# Patient Record
Sex: Male | Born: 1980 | Race: White | Hispanic: No | Marital: Married | Smoking: Former smoker
Health system: Southern US, Community
[De-identification: ages and names within clinical notes are randomized; demographics above are authoritative.]

## PROBLEM LIST (undated history)

## (undated) DIAGNOSIS — F32A Depression, unspecified: Secondary | ICD-10-CM

## (undated) DIAGNOSIS — F4310 Post-traumatic stress disorder, unspecified: Secondary | ICD-10-CM

## (undated) DIAGNOSIS — F329 Major depressive disorder, single episode, unspecified: Secondary | ICD-10-CM

## (undated) DIAGNOSIS — I10 Essential (primary) hypertension: Secondary | ICD-10-CM

---

## 2014-10-17 DIAGNOSIS — G4700 Insomnia, unspecified: Secondary | ICD-10-CM | POA: Insufficient documentation

## 2014-10-17 DIAGNOSIS — G43909 Migraine, unspecified, not intractable, without status migrainosus: Secondary | ICD-10-CM | POA: Insufficient documentation

## 2014-10-31 LAB — LIPID PANEL
CHOLESTEROL: 161 mg/dL (ref 0–200)
HDL: 26 mg/dL — AB (ref 35–70)
LDL Cholesterol: 110 mg/dL
TRIGLYCERIDES: 127 mg/dL (ref 40–160)

## 2014-10-31 LAB — TSH: TSH: 0.53 u[IU]/mL (ref 0.41–5.90)

## 2014-10-31 LAB — HEPATIC FUNCTION PANEL
ALK PHOS: 118 U/L (ref 25–125)
ALT: 25 U/L (ref 10–40)
AST: 17 U/L (ref 14–40)
BILIRUBIN, TOTAL: 0.3 mg/dL
Bilirubin, Direct: 0.1 mg/dL (ref 0.01–0.4)

## 2014-10-31 LAB — BASIC METABOLIC PANEL
BUN: 15 mg/dL (ref 4–21)
CREATININE: 1 mg/dL (ref 0.6–1.3)
Glucose: 103 mg/dL
Potassium: 4.2 mmol/L (ref 3.4–5.3)
Sodium: 141 mmol/L (ref 137–147)

## 2014-10-31 LAB — HEMOGLOBIN A1C: HEMOGLOBIN A1C: 5.5

## 2014-11-07 ENCOUNTER — Ambulatory Visit
Admit: 2014-11-07 | Disposition: A | Discharge status: Home / Self Care | Payer: Self-pay | Attending: Urology | Admitting: Urology

## 2014-12-14 ENCOUNTER — Ambulatory Visit: Payer: Self-pay | Admitting: Licensed Clinical Social Worker

## 2015-01-11 ENCOUNTER — Ambulatory Visit: Payer: Self-pay

## 2015-01-25 ENCOUNTER — Ambulatory Visit: Payer: Self-pay

## 2015-01-25 DIAGNOSIS — I10 Essential (primary) hypertension: Secondary | ICD-10-CM | POA: Insufficient documentation

## 2015-01-25 DIAGNOSIS — D72829 Elevated white blood cell count, unspecified: Secondary | ICD-10-CM | POA: Insufficient documentation

## 2015-01-25 DIAGNOSIS — I8390 Asymptomatic varicose veins of unspecified lower extremity: Secondary | ICD-10-CM | POA: Insufficient documentation

## 2015-01-25 DIAGNOSIS — R635 Abnormal weight gain: Secondary | ICD-10-CM | POA: Insufficient documentation

## 2015-01-25 LAB — CBC AND DIFFERENTIAL
HEMATOCRIT: 45 % (ref 41–53)
HEMOGLOBIN: 15.4 g/dL (ref 13.5–17.5)
Neutrophils Absolute: 7 /uL
PLATELETS: 253 10*3/uL (ref 150–399)
WBC: 10.8 10*3/mL

## 2015-03-20 ENCOUNTER — Other Ambulatory Visit: Payer: Self-pay

## 2015-04-03 ENCOUNTER — Ambulatory Visit: Payer: Self-pay

## 2015-11-29 DIAGNOSIS — D72829 Elevated white blood cell count, unspecified: Secondary | ICD-10-CM

## 2015-11-29 DIAGNOSIS — I10 Essential (primary) hypertension: Secondary | ICD-10-CM

## 2015-11-29 DIAGNOSIS — G4700 Insomnia, unspecified: Secondary | ICD-10-CM

## 2015-11-29 DIAGNOSIS — R635 Abnormal weight gain: Secondary | ICD-10-CM

## 2015-11-29 DIAGNOSIS — G43919 Migraine, unspecified, intractable, without status migrainosus: Secondary | ICD-10-CM

## 2015-11-29 DIAGNOSIS — I8390 Asymptomatic varicose veins of unspecified lower extremity: Secondary | ICD-10-CM

## 2016-01-28 ENCOUNTER — Emergency Department
Admission: EM | Admit: 2016-01-28 | Discharge: 2016-01-28 | Disposition: A | Discharge status: Home / Self Care | Payer: Self-pay | Attending: Emergency Medicine | Admitting: Emergency Medicine

## 2016-01-28 DIAGNOSIS — F419 Anxiety disorder, unspecified: Secondary | ICD-10-CM

## 2016-01-28 DIAGNOSIS — F32A Depression, unspecified: Secondary | ICD-10-CM

## 2016-01-28 DIAGNOSIS — Z87891 Personal history of nicotine dependence: Secondary | ICD-10-CM | POA: Insufficient documentation

## 2016-01-28 DIAGNOSIS — I10 Essential (primary) hypertension: Secondary | ICD-10-CM | POA: Insufficient documentation

## 2016-01-28 DIAGNOSIS — Z79899 Other long term (current) drug therapy: Secondary | ICD-10-CM | POA: Insufficient documentation

## 2016-01-28 DIAGNOSIS — F329 Major depressive disorder, single episode, unspecified: Secondary | ICD-10-CM | POA: Insufficient documentation

## 2016-01-28 LAB — CBC WITH DIFFERENTIAL/PLATELET
BASOS ABS: 0 10*3/uL (ref 0–0.1)
Basophils Relative: 0 %
EOS PCT: 0 %
Eosinophils Absolute: 0 10*3/uL (ref 0–0.7)
HCT: 47.1 % (ref 40.0–52.0)
HEMOGLOBIN: 16.1 g/dL (ref 13.0–18.0)
LYMPHS PCT: 18 %
Lymphs Abs: 2.2 10*3/uL (ref 1.0–3.6)
MCH: 31.8 pg (ref 26.0–34.0)
MCHC: 34.1 g/dL (ref 32.0–36.0)
MCV: 93.3 fL (ref 80.0–100.0)
Monocytes Absolute: 1 10*3/uL (ref 0.2–1.0)
Monocytes Relative: 8 %
NEUTROS ABS: 9.4 10*3/uL — AB (ref 1.4–6.5)
NEUTROS PCT: 74 %
PLATELETS: 202 10*3/uL (ref 150–440)
RBC: 5.05 MIL/uL (ref 4.40–5.90)
RDW: 13.1 % (ref 11.5–14.5)
WBC: 12.7 10*3/uL — AB (ref 3.8–10.6)

## 2016-01-28 LAB — COMPREHENSIVE METABOLIC PANEL
ALT: 28 U/L (ref 17–63)
ANION GAP: 8 (ref 5–15)
AST: 20 U/L (ref 15–41)
Albumin: 4.5 g/dL (ref 3.5–5.0)
Alkaline Phosphatase: 98 U/L (ref 38–126)
BUN: 14 mg/dL (ref 6–20)
CHLORIDE: 107 mmol/L (ref 101–111)
CO2: 24 mmol/L (ref 22–32)
Calcium: 9.1 mg/dL (ref 8.9–10.3)
Creatinine, Ser: 0.8 mg/dL (ref 0.61–1.24)
Glucose, Bld: 97 mg/dL (ref 65–99)
POTASSIUM: 3.7 mmol/L (ref 3.5–5.1)
SODIUM: 139 mmol/L (ref 135–145)
Total Bilirubin: 1.3 mg/dL — ABNORMAL HIGH (ref 0.3–1.2)
Total Protein: 7.6 g/dL (ref 6.5–8.1)

## 2016-01-28 LAB — LIPASE, BLOOD: LIPASE: 17 U/L (ref 11–51)

## 2016-01-28 NOTE — ED Provider Notes (Signed)
[facility] Emergency Department Provider Note   ____________________________________________  Time seen:  I have reviewed the triage vital signs and the triage nursing note.  HISTORY  Chief Complaint Abdominal Pain   Historian Patient  HPI Malik Flores is a 35 y.o. male who is here for evaluation of anxiety and depression. Patient states that he has had no appetite and has recurrent thoughts of anxiety and fear and depression. 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 He takes care of 2 young children at home and handles most of the housework as his wife is currently a Consulting civil engineerstudent. He suffered the loss of his child at 35 years old about 2 years ago and still has a lot of grief and anxiety and depression related to that. He's not been previously diagnosed or treated for depression or anxiety, but states that several years ago even before he lost his child he had some symptoms similar to now in terms of decreased by mouth appetite and had an EGD and was told that his symptoms were probably due to stress.  He is having no thoughts of suicide. He is interested and potentially getting a referral to see a psychiatrist.  He states he had previously been on blood pressure medication, but then his blood pressure is under control until he had been off of it.    History reviewed. No pertinent past medical history.  Patient Active Problem List   Diagnosis Date Noted  . Hypertension 01/25/2015  . Weight increase 01/25/2015  . Leucocytosis 01/25/2015  . Varicosities of leg 01/25/2015  . Insomnia 10/17/2014  . Migraines 10/17/2014    History reviewed. No pertinent past surgical history.  Current Outpatient Rx  Name  Route  Sig  Dispense  Refill  . amLODipine (NORVASC) 5 MG tablet   Oral   Take 5 mg by mouth daily.         . nortriptyline (PAMELOR)  25 MG capsule   Oral   Take 25 mg by mouth at bedtime.           Allergies Review of patient's allergies indicates no known allergies.  Family History  Problem Relation Age of Onset  . Diabetes Mother   . Stroke Mother   . Hypertension Mother   . Diabetes Father   . Heart disease Father   . Lung disease Daughter     chronic  . Cancer Maternal Grandmother     Pancreatic  . Cancer Paternal Grandfather     Social History Social History  Substance Use Topics  . Smoking status: Former Smoker    Quit date: 07/28/2006  . Smokeless tobacco: None  . Alcohol Use: No    Review of Systems  Constitutional: Negative for fever. Eyes: Negative for visual changes. ENT: Negative for sore throat. Cardiovascular: Negative for chest pain. Respiratory: Negative for shortness of breath. Gastrointestinal: Negative for abdominal pain, vomiting and diarrhea.Decreased appetite Genitourinary: Negative for dysuria. Musculoskeletal: Negative for back pain. Skin: Negative for rash. Neurological: Negative for headache. 10 point Review of Systems otherwise negative ____________________________________________   PHYSICAL EXAM:  VITAL SIGNS: ED Triage Vitals  Enc Vitals Group     BP 01/28/16 1730 130/92 mmHg     Pulse Rate 01/28/16 1730 80     Resp 01/28/16 1730 20     Temp 01/28/16 1730 98.1 F (  36.7 C)     Temp Source 01/28/16 1730 Oral     SpO2 01/28/16 1730 98 %     Weight 01/28/16 1730 240 lb (108.863 kg)     Height 01/28/16 1730 6\' 4"  (1.93 m)     Head Cir --      Peak Flow --      Pain Score 01/28/16 1739 3     Pain Loc --      Pain Edu? --      Excl. in GC? --      Constitutional: Alert and oriented. Well appearing and in no distress, but at times anxious and tearful. HEENT   Head: Normocephalic and atraumatic.      Eyes: Conjunctivae are normal. PERRL. Normal extraocular movements.      Ears:         Nose: No congestion/rhinnorhea.   Mouth/Throat: Mucous  membranes are moist.   Neck: No stridor. Cardiovascular/Chest: Normal rate, regular rhythm.  No murmurs, rubs, or gallops. Respiratory: Normal respiratory effort without tachypnea nor retractions. Breath sounds are clear and equal bilaterally. No wheezes/rales/rhonchi. Gastrointestinal: Soft. No distention, no guarding, no rebound. Nontender.    Genitourinary/rectal:Deferred Musculoskeletal: Nontender with normal range of motion in all extremities. No joint effusions.  No lower extremity tenderness.  No edema. Neurologic:  Normal speech and language. No gross or focal neurologic deficits are appreciated. Skin:  Skin is warm, dry and intact. No rash noted. Psychiatric: Intermittently tearful  ____________________________________________   EKG I, Governor Rooksebecca Jaedin Regina, MD, the attending physician have personally viewed and interpreted all ECGs.  none ____________________________________________  LABS (pertinent positives/negatives)  Labs Reviewed  COMPREHENSIVE METABOLIC PANEL - Abnormal; Notable for the following:    Total Bilirubin 1.3 (*)    All other components within normal limits  CBC WITH DIFFERENTIAL/PLATELET - Abnormal; Notable for the following:    WBC 12.7 (*)    Neutro Abs 9.4 (*)    All other components within normal limits  LIPASE, BLOOD    ____________________________________________  RADIOLOGY All Xrays were viewed by me. Imaging interpreted by Radiologist.  none __________________________________________  PROCEDURES  Procedure(s) performed: None  Critical Care performed: None  ____________________________________________   ED COURSE / ASSESSMENT AND PLAN  Pertinent labs & imaging results that were available during my care of the patient were reviewed by me and considered in my medical decision making (see chart for details).    This patient is here with what sounds mostly like posttraumatic stress ever since the loss of his child 2 years ago. He has  other stressors including young children at home, and joblessness at the moment, but he has a bright outlook overall. He is not having suicidal ideation. He does need a referral for outpatient psychiatric evaluation and primary care doctor, but he does not meet any criteria for inpatient admission or emergency psychiatric evaluation.  In terms of the abdominal discomfort, it sounds more like decreased appetite which I think is stress and depression related.   CONSULTATIONS:   None   Patient / Family / Caregiver informed of clinical course, medical decision-making process, and agree with plan.   I discussed return precautions, follow-up instructions, and discharged instructions with patient and/or family.   ___________________________________________   FINAL CLINICAL IMPRESSION(S) / ED DIAGNOSES   Final diagnoses:  Anxiety  Depression              Note: This dictation was prepared with Dragon dictation. Any transcriptional errors that result from this process are  unintentional   Governor Rooks, MD 01/28/16 2036

## 2016-01-28 NOTE — Discharge Instructions (Signed)
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  We discussed, I do think that you should be followed up by primary care physician as well as a psychiatry intake evaluation, and information was provided [facility] and [facility]" for psychiatric evaluation.  We also discussed you may want to look into the program called [employer] by Blanchie DessertAnnie Hopper - may google search for website for further information.  Return to the ER for any worsening condition including worsening depression, or any thoughts of wanting to hurt herself or others.   Major Depressive Disorder Major depressive disorder is a mental illness. It also may be called clinical depression or unipolar depression. Major depressive disorder usually causes feelings of sadness, hopelessness, or helplessness. Some people with this disorder do not feel particularly sad but lose interest in doing things they used to enjoy (anhedonia). Major depressive disorder also can cause physical symptoms. It can interfere with work, school, relationships, and other normal everyday activities. The disorder varies in severity but is longer lasting and more serious than the sadness we all feel from time to time in our lives. Major depressive disorder often is triggered by stressful life events or major life changes. Examples of these triggers include divorce, loss of your job or home, a move, and the death of a family member or close friend. Sometimes this disorder occurs for no obvious reason at all. People who have family members with major depressive disorder or bipolar disorder are at higher risk for developing this disorder, with or without life stressors. Major depressive disorder can occur at any age. It may occur just once in your life (single episode major depressive disorder). It may occur multiple times (recurrent major depressive disorder). SYMPTOMS People  with major depressive disorder have either anhedonia or depressed mood on nearly a daily basis for at least 2 weeks or longer. Symptoms of depressed mood include:  Feelings of sadness (blue or down in the dumps) or emptiness.  Feelings of hopelessness or helplessness.  Tearfulness or episodes of crying (may be observed by others).  Irritability (children and adolescents). In addition to depressed mood or anhedonia or both, people with this disorder have at least four of the following symptoms:  Difficulty sleeping or sleeping too much.   Significant change (increase or decrease) in appetite or weight.   Lack of energy or motivation.  Feelings of guilt and worthlessness.   Difficulty concentrating, remembering, or making decisions.  Unusually slow movement (psychomotor retardation) or restlessness (as observed by others).   Recurrent wishes for death, recurrent thoughts of self-harm (suicide), or a suicide attempt. People with major depressive disorder commonly have persistent negative thoughts about themselves, other people, and the world. People with severe major depressive disorder may experiencedistorted beliefs or perceptions about the world (psychotic delusions). They also may see or hear things that are not real (psychotic hallucinations). DIAGNOSIS Major depressive disorder is diagnosed through an assessment by your health care provider. Your health care provider will ask aboutaspects of your daily life, such as mood,sleep, and appetite, to see if you have the diagnostic symptoms of major depressive disorder. Your health care provider may ask about your medical history and use of alcohol or drugs, including prescription medicines. Your health care provider also may do a physical exam and blood work. This is because certain medical conditions and the use of certain substances can cause major depressive disorder-like symptoms (secondary depression). Your health  care provider  also may refer you to a mental health specialist for further evaluation and treatment. TREATMENT It is important to recognize the symptoms of major depressive disorder and seek treatment. The following treatments can be prescribed for this disorder:   Medicine. Antidepressant medicines usually are prescribed. Antidepressant medicines are thought to correct chemical imbalances in the brain that are commonly associated with major depressive disorder. Other types of medicine may be added if the symptoms do not respond to antidepressant medicines alone or if psychotic delusions or hallucinations occur.  Talk therapy. Talk therapy can be helpful in treating major depressive disorder by providing support, education, and guidance. Certain types of talk therapy also can help with negative thinking (cognitive behavioral therapy) and with relationship issues that trigger this disorder (interpersonal therapy). A mental health specialist can help determine which treatment is best for you. Most people with major depressive disorder do well with a combination of medicine and talk therapy. Treatments involving electrical stimulation of the brain can be used in situations with extremely severe symptoms or when medicine and talk therapy do not work over time. These treatments include electroconvulsive therapy, transcranial magnetic stimulation, and vagal nerve stimulation.   This information is not intended to replace advice given to you by your health care provider. Make sure you discuss any questions you have with your health care provider.   Document Released: 11/08/2012 Document Revised: 08/04/2014 Document Reviewed: 11/08/2012 Elsevier Interactive Patient Education [employer]2016 Elsevier Inc.

## 2016-01-28 NOTE — ED Notes (Signed)
Pt became sick last Monday with abdominal pain/vomiting (stating sm amount of bright red blood from throat irritation) - Continues with upset stomach for the past week - In the last 24 hours vomited 4 times (no relief from OTC meds and was told he vomits due to "to much stress") - He went to ER in [place] and they told him that abd pain was related to stress - pt denies any other issues 

## 2016-01-28 NOTE — ED Notes (Signed)
Pt became sick last Monday with abdominal pain/vomiting (stating sm amount of bright red blood from throat irritation) - Continues with upset stomach for the past week - In the last 24 hours vomited 4 times (no relief from OTC meds and was told he vomits due to "to much stress") - He went to ER in [place] and they told him that abd pain was related to stress - pt denies any other issues

## 2016-04-22 ENCOUNTER — Encounter: Payer: Self-pay | Admitting: Emergency Medicine

## 2016-04-22 ENCOUNTER — Emergency Department
Admission: EM | Admit: 2016-04-22 | Discharge: 2016-04-23 | Disposition: A | Discharge status: Home / Self Care | Payer: Self-pay | Attending: Emergency Medicine | Admitting: Emergency Medicine

## 2016-04-22 DIAGNOSIS — F4321 Adjustment disorder with depressed mood: Secondary | ICD-10-CM

## 2016-04-22 DIAGNOSIS — Z87891 Personal history of nicotine dependence: Secondary | ICD-10-CM | POA: Insufficient documentation

## 2016-04-22 DIAGNOSIS — T887XXA Unspecified adverse effect of drug or medicament, initial encounter: Secondary | ICD-10-CM

## 2016-04-22 DIAGNOSIS — Z79899 Other long term (current) drug therapy: Secondary | ICD-10-CM | POA: Insufficient documentation

## 2016-04-22 DIAGNOSIS — F32A Depression, unspecified: Secondary | ICD-10-CM

## 2016-04-22 DIAGNOSIS — R319 Hematuria, unspecified: Secondary | ICD-10-CM

## 2016-04-22 DIAGNOSIS — R55 Syncope and collapse: Secondary | ICD-10-CM

## 2016-04-22 DIAGNOSIS — I951 Orthostatic hypotension: Secondary | ICD-10-CM

## 2016-04-22 DIAGNOSIS — I10 Essential (primary) hypertension: Secondary | ICD-10-CM | POA: Insufficient documentation

## 2016-04-22 DIAGNOSIS — F322 Major depressive disorder, single episode, severe without psychotic features: Secondary | ICD-10-CM

## 2016-04-22 DIAGNOSIS — F329 Major depressive disorder, single episode, unspecified: Secondary | ICD-10-CM | POA: Insufficient documentation

## 2016-04-22 HISTORY — DX: Major depressive disorder, single episode, unspecified: F32.9

## 2016-04-22 HISTORY — DX: Essential (primary) hypertension: I10

## 2016-04-22 HISTORY — DX: Post-traumatic stress disorder, unspecified: F43.10

## 2016-04-22 HISTORY — DX: Depression, unspecified: F32.A

## 2016-04-22 LAB — URINE DRUG SCREEN, QUALITATIVE (ARMC ONLY)
Amphetamines, Ur Screen: NOT DETECTED
BARBITURATES, UR SCREEN: NOT DETECTED
BENZODIAZEPINE, UR SCRN: NOT DETECTED
CANNABINOID 50 NG, UR ~~LOC~~: NOT DETECTED
COCAINE METABOLITE, UR ~~LOC~~: NOT DETECTED
MDMA (Ecstasy)Ur Screen: NOT DETECTED
METHADONE SCREEN, URINE: NOT DETECTED
Opiate, Ur Screen: NOT DETECTED
Phencyclidine (PCP) Ur S: NOT DETECTED
TRICYCLIC, UR SCREEN: NOT DETECTED

## 2016-04-22 LAB — BASIC METABOLIC PANEL
Anion gap: 8 (ref 5–15)
BUN: 13 mg/dL (ref 6–20)
CALCIUM: 9.3 mg/dL (ref 8.9–10.3)
CO2: 23 mmol/L (ref 22–32)
CREATININE: 0.95 mg/dL (ref 0.61–1.24)
Chloride: 107 mmol/L (ref 101–111)
Glucose, Bld: 114 mg/dL — ABNORMAL HIGH (ref 65–99)
Potassium: 3.7 mmol/L (ref 3.5–5.1)
SODIUM: 138 mmol/L (ref 135–145)

## 2016-04-22 LAB — URINALYSIS COMPLETE WITH MICROSCOPIC (ARMC ONLY)
BILIRUBIN URINE: NEGATIVE
Bacteria, UA: NONE SEEN
Glucose, UA: NEGATIVE mg/dL
LEUKOCYTES UA: NEGATIVE
Nitrite: NEGATIVE
PH: 6 (ref 5.0–8.0)
Protein, ur: 30 mg/dL — AB
Specific Gravity, Urine: 1.023 (ref 1.005–1.030)

## 2016-04-22 LAB — CBC
HCT: 50.5 % (ref 40.0–52.0)
Hemoglobin: 17 g/dL (ref 13.0–18.0)
MCH: 31.5 pg (ref 26.0–34.0)
MCHC: 33.7 g/dL (ref 32.0–36.0)
MCV: 93.6 fL (ref 80.0–100.0)
PLATELETS: 232 10*3/uL (ref 150–440)
RBC: 5.4 MIL/uL (ref 4.40–5.90)
RDW: 12.6 % (ref 11.5–14.5)
WBC: 13 10*3/uL — AB (ref 3.8–10.6)

## 2016-04-22 LAB — ETHANOL: Alcohol, Ethyl (B): <5 mg/dL (ref <5)

## 2016-04-22 LAB — SALICYLATE LEVEL: Salicylate Lvl: <4 mg/dL (ref 2.8–30.0)

## 2016-04-22 MED ORDER — MECLIZINE HCL 25 MG PO TABS
25.0000 mg | ORAL_TABLET | Freq: Once | ORAL | Status: AC
  Administered 2016-04-22: 25 mg via ORAL
  Filled 2016-04-22 (×2): qty 1

## 2016-04-22 NOTE — ED Notes (Signed)
Dr. Sharma CovertNorman signed the EKG

## 2016-04-22 NOTE — ED Notes (Signed)
Sherilyn CooterHenry, RN in room to dress pt out.

## 2016-04-22 NOTE — ED Notes (Signed)
Dr. Pershing ProudSchaevitz went in to talk to pt about D/C. Pt stating if he goes home wife will yell at him and he will have a panic attack. Pt requesting to speak to psychiatrist. Pt informed about process of dressing out and moving to new bed and speaking to doctor in the morning. Pt still requesting to stay.

## 2016-04-22 NOTE — ED Notes (Signed)
Dr. Manson PasseyBrown [facility]

## 2016-04-22 NOTE — ED Provider Notes (Signed)
[facility] Emergency Department Provider Note   ____________________________________________   First MD Initiated Contact with Patient 04/22/16 1918     (approximate)  I have reviewed the triage vital signs and the nursing notes.   HISTORY  Chief Complaint Dizziness   HPI Malik Flores is a 35 y.o. male with a history of depression, hypertension and PTSD as well as anxiety who is presenting to the emergency department with dizziness and lightheadedness since starting Lexapro and prazosin. He says that he wants [facility] this past Friday for insomnia and stress. He says that he is unable to sleep because he has racing thoughts. He is denying any suicidal or homicidal ideation. He was placed on these 2 medications and has says that he has been lightheaded and dizziness ever since. He says that he has periods of time during the day where he is vision goes black when he was feeling very lightheaded. He says that he has not fallen but is able to regain his composure. He says that one week ago he also had what sounds like a URI with runny nose. He denies any ear pain or ringing or pressure at this time.   Past Medical History:  Diagnosis Date  . Depression   . Hypertension   . PTSD (post-traumatic stress disorder)     Patient Active Problem List   Diagnosis Date Noted  . Hypertension 01/25/2015  . Weight increase 01/25/2015  . Leucocytosis 01/25/2015  . Varicosities of leg 01/25/2015  . Insomnia 10/17/2014  . Migraines 10/17/2014    History reviewed. No pertinent surgical history.  Prior to Admission medications   Medication Sig Start Date End Date Taking? Authorizing Provider  escitalopram (LEXAPRO) 20 MG tablet Take 20 mg by mouth daily.   Yes Historical Provider, MD  prazosin (MINIPRESS) 1 MG capsule Take 1 mg by mouth at bedtime.   Yes Historical Provider, MD  amLODipine (NORVASC) 5 MG tablet Take 5 mg by mouth daily.    Historical Provider, MD    nortriptyline (PAMELOR) 25 MG capsule Take 25 mg by mouth at bedtime.    Historical Provider, MD    Allergies Review of patient's allergies indicates no known allergies.  Family History  Problem Relation Age of Onset  . Diabetes Mother   . Stroke Mother   . Hypertension Mother   . Diabetes Father   . Heart disease Father   . Lung disease Daughter     chronic  . Cancer Maternal Grandmother     Pancreatic  . Cancer Paternal Grandfather     Social History Social History  Substance Use Topics  . Smoking status: Former Smoker    Quit date: 07/28/2006  . Smokeless tobacco: Never Used  . Alcohol use No    Review of Systems Constitutional: No fever/chills Eyes: No visual changes. ENT: No sore throat. Cardiovascular: Denies chest pain. Respiratory: Denies shortness of breath. Gastrointestinal: No abdominal pain.  No nausea, no vomiting.  No diarrhea.  No constipation. Genitourinary: Negative for dysuria. Musculoskeletal: Negative for back pain. Skin: Negative for rash. Neurological: Negative for headaches, focal weakness or numbness.  10-point ROS otherwise negative.  ____________________________________________   PHYSICAL EXAM:  VITAL SIGNS: ED Triage Vitals  Enc Vitals Group     BP 04/22/16 1848 (!) 158/81     Pulse Rate 04/22/16 1848 86     Resp 04/22/16 1848 20     Temp 04/22/16 1848 97.8 F (36.6 C)     Temp Source  04/22/16 1848 Oral     SpO2 04/22/16 1848 97 %     Weight 04/22/16 1847 240 lb (108.9 kg)     Height 04/22/16 1847 6\' 5"  (1.956 m)     Head Circumference --      Peak Flow --      Pain Score 04/22/16 1847 3     Pain Loc --      Pain Edu? --      Excl. in GC? --     Constitutional: Alert and oriented. Well appearing and in no acute distress. Eyes: Conjunctivae are normal. PERRL. EOMI. Head: Atraumatic.TMs are normal bilaterally. Nose: No congestion/rhinnorhea. Mouth/Throat: Mucous membranes are moist.   Neck: No stridor.    Cardiovascular: Normal rate, regular rhythm. Grossly normal heart sounds.   Respiratory: Normal respiratory effort.  No retractions. Lungs CTAB. Gastrointestinal: Soft and nontender. No distention. No abdominal bruits. No CVA tenderness. Musculoskeletal: No lower extremity tenderness nor edema.  No joint effusions. Neurologic:  Normal speech and language. No gross focal neurologic deficits are appreciated. Mild instability to his gait. No nystagmus. No ataxia on finger to nose testing. Skin:  Skin is warm, dry and intact. No rash noted. Psychiatric: Mood and affect are normal. Speech and behavior are normal.  ____________________________________________   LABS (all labs ordered are listed, but only abnormal results are displayed)  Labs Reviewed  BASIC METABOLIC PANEL - Abnormal; Notable for the following:       Result Value   Glucose, Bld 114 (*)    All other components within normal limits  CBC - Abnormal; Notable for the following:    WBC 13.0 (*)    All other components within normal limits  URINALYSIS COMPLETEWITH MICROSCOPIC ([facility] ONLY) - Abnormal; Notable for the following:    Color, Urine YELLOW (*)    APPearance CLEAR (*)    Ketones, ur TRACE (*)    Hgb urine dipstick 2+ (*)    Protein, ur 30 (*)    Squamous Epithelial / LPF 0-5 (*)    All other components within normal limits   ____________________________________________  EKG  ED ECG REPORT I, Arelia Longest, the attending physician, personally viewed and interpreted this ECG.   Date: 04/22/2016  EKG Time: 1854  Rate: 95  Rhythm: normal sinus rhythm  Axis: Normal  Intervals:none  ST&T Change: No ST segment elevation or depression. No abnormal T-wave inversion.  ____________________________________________  RADIOLOGY   ____________________________________________   PROCEDURES  Procedure(s) performed:   Procedures  Critical Care performed:    ____________________________________________   INITIAL IMPRESSION / ASSESSMENT AND PLAN / ED COURSE  Pertinent labs & imaging results that were available during my care of the patient were reviewed by me and considered in my medical decision making (see chart for details).  Also discussed hematuria and follow-up with urology. Appears to be symptomatic. Patient without any burning or noticeable blood in his urine. Recommended the patient that he stop taking his current medications. I also discussed the hours at [facility] with him and discussed with him possible stenting at the hospital overnight versus outpatient follow-up and he says that he thinks it he'll be able to follow up as an outpatient. He is denying any suicidal or homicidal ideations. He is clinically sober. He appears to have good insight into his medical condition and capacity to be making medical decisions.  Clinical Course   ----------------------------------------- 10:47 PM on 04/22/2016 -----------------------------------------  Patient says that he is feeling improved after the meclizine  but says that he may become extremely anxious and depressed because home and his wife feels that him which is expecting is what will happen. He is denying any suicidal or homicidal thoughts at this time but says that if he is involved in a competent at home that these thoughts may present themselves. I asked him if he feels safe going home and he says no and would rather stay in the hospital stick with the psychiatrist. He'll be transferred to the quad at this time. Signed out to Dr. Huel Cote.  ____________________________________________   FINAL CLINICAL IMPRESSION(S) / ED DIAGNOSES  Hematuria. Near syncope. Vertigo. Depression.    NEW MEDICATIONS STARTED DURING THIS VISIT:  New Prescriptions   No medications on file     Note:  This document was prepared using Dragon voice recognition software and may include unintentional dictation  errors.    Myrna Blazer, MD 04/22/16 [phone]

## 2016-04-22 NOTE — ED Notes (Signed)
Pt states "I think I'm having a reaction to the medicines I'm on" States started escitalopram and prazosin on Friday. Pt states dizzy. Pt states "I've blacked out once or twice a day since starting them, not falling but where I can't see." States he has been "extremely tired and these are supposed to help me sleep." Pt alert and oriented x4, respirations even and unlabored, no apparent distress noted. Pt lying on side on stretcher.

## 2016-04-22 NOTE — ED Triage Notes (Signed)
States he was recently started on lexapro and prazosin last week  Has been  Dizzy since

## 2016-04-23 DIAGNOSIS — I951 Orthostatic hypotension: Secondary | ICD-10-CM

## 2016-04-23 DIAGNOSIS — T887XXA Unspecified adverse effect of drug or medicament, initial encounter: Secondary | ICD-10-CM

## 2016-04-23 DIAGNOSIS — F4321 Adjustment disorder with depressed mood: Secondary | ICD-10-CM

## 2016-04-23 DIAGNOSIS — F322 Major depressive disorder, single episode, severe without psychotic features: Secondary | ICD-10-CM

## 2016-04-23 NOTE — Consult Note (Signed)
[facility] Consult   Reason for Consult:  Consult for 35 year old man with symptoms of depression who came to the hospital initially complaining of side effects Referring Physician:  Eual Fines Patient Identification: Malik Flores MRN:  [id number] Principal Diagnosis: Severe major depression, single episode, without psychotic features [facility]) Diagnosis:   Patient Active Problem List   Diagnosis Date Noted  . Severe major depression, single episode, without psychotic features ([place]) [F32.2] 04/23/2016  . Grief [F43.21] 04/23/2016  . Medication side effects [T88.7XXA] 04/23/2016  . Orthostatic hypotension [I95.1] 04/23/2016  . Hypertension [I10] 01/25/2015  . Weight increase [R63.5] 01/25/2015  . Leucocytosis [D72.829] 01/25/2015  . Varicosities of leg [I83.93] 01/25/2015  . Insomnia [G47.00] 10/17/2014  . Migraines [G43.909] 10/17/2014    Total Time spent with patient: 1 hour  Subjective:   Malik Flores is a 35 y.o. male patient admitted with "I was feeling dizzy".  HPI:  Patient interviewed. Chart reviewed. Labs and vitals reviewed. This is a 36 year old man who came to the emergency room complaining of dizziness which she believed was a side effect of a medication he had recently started. He started taking medicine for his depression prescribed by [facility] approximately a week to 10 days ago. Ever since starting the medicine he has been having episodes of dizziness most of which happen when he is standing up quickly. Patient is having multiple symptoms however of depression still ongoing. He has been feeling sad most of the time ever since his daughter died which was about a year and a half ago but his symptoms of depression have actually been escalating over the past several months. He feels down and sad and nervous much of the time. Feels overwhelmed. Tired almost all the time. Has a great deal of difficulty sleeping. Only sleeps by his estimate 2-4 hours most nights.  He's lost about 20 pounds. He has continued to function at his job and function as a parent. He describes major stress in addition to the ongoing grief as being related to his relationship with his wife. Patient works daytime hours. Wife apparently had been going to school and comes home and every night according to the patient is going out to some kind of function. He often feels he doesn't know where she is going. He clearly implies that he is worried about her trustworthiness and he misses her a great deal. Sounds like when he is tried to talk with her about it he feels like he's been dismissed. Patient denies that he is using any alcohol. Denies any other drug abuse. He went to [facility] for an intake and then saw a prescriber. He was started on Lexapro 20 mg a day and Minipress 1 mg at night. Denies having any suicidal thoughts whatsoever. Denies any homicidal thoughts. Denies any psychotic symptoms.  Social history: Married. 2 children ages 22 and 7 still at home. 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 Having some difficulties in his marriage.  Medical history: Hypertension is listed here on the chart that the patient denies that he has any other active medical problems.  Substance abuse history: He says that he has smoked marijuana very occasionally and the last time he did it was a couple months ago. Denies other drug abuse and denies any alcohol abuse.  Past Psychiatric History: No history of psychiatric hospitalization. No history of suicide attempts. Had never been prescribed any other medication in the past. No history of mania or psychosis.  Risk to  Self: Suicidal Ideation: No-Not Currently/Within Last 6 Months Suicidal Intent: No-Not Currently/Within Last 6 Months Is patient at risk for suicide?: No Suicidal Plan?: No-Not Currently/Within Last 6 Months Access to Means: No What has been your use of drugs/alcohol within the last 12 months?: na How many times?: 0 Other Self  Harm Risks: na Triggers for Past Attempts:  (na) Intentional Self Injurious Behavior: None Risk to Others: Homicidal Ideation: No-Not Currently/Within Last 6 Months Thoughts of Harm to Others: No-Not Currently Present/Within Last 6 Months Current Homicidal Intent: No-Not Currently/Within Last 6 Months Current Homicidal Plan: No-Not Currently/Within Last 6 Months Access to Homicidal Means: No Identified Victim: na History of harm to others?: No Assessment of Violence: None Noted Violent Behavior Description: none reported Does patient have access to weapons?: No Criminal Charges Pending?: No Does patient have a court date: No Prior Inpatient Therapy: Prior Inpatient Therapy: No Prior Outpatient Therapy: Prior Outpatient Therapy: Yes Prior Therapy Dates: currently Prior Therapy Facilty/Provider(s): [facility] Reason for Treatment: Depression, PTSD Does patient have an ACCT team?: No Does patient have Intensive In-House Services?  : No Does patient have [facility] services? : No Does patient have P4CC services?: No  Past Medical History:  Past Medical History:  Diagnosis Date  . Depression   . Hypertension   . PTSD (post-traumatic stress disorder)    History reviewed. No pertinent surgical history. Family History:  Family History  Problem Relation Age of Onset  . Diabetes Mother   . Stroke Mother   . Hypertension Mother   . Diabetes Father   . Heart disease Father   . Lung disease Daughter     chronic  . Cancer Maternal Grandmother     Pancreatic  . Cancer Paternal Grandfather    Family Psychiatric  History: Michela Pitcher that he thought that his mother was depressed. There is no family history of suicide. Social History:  History  Alcohol Use No     History  Drug use: Unknown    Social History   Social History  . Marital status: Married    Spouse name: N/A  . Number of children: N/A  . Years of education: N/A   Social History Main Topics  . Smoking status: Former Smoker     Quit date: 07/28/2006  . Smokeless tobacco: Never Used  . Alcohol use No  . Drug use: Unknown  . Sexual activity: Not Asked   Other Topics Concern  . None   Social History Narrative  . None   Additional Social History:    Allergies:  No Known Allergies  Labs:  Results for orders placed or performed during the hospital encounter of 04/22/16 (from the past 48 hour(s))  Basic metabolic panel     Status: Abnormal   Collection Time: 04/22/16  6:51 PM  Result Value Ref Range   Sodium 138 135 - 145 mmol/L   Potassium 3.7 3.5 - 5.1 mmol/L   Chloride 107 101 - 111 mmol/L   CO2 23 22 - 32 mmol/L   Glucose, Bld 114 (H) 65 - 99 mg/dL   BUN 13 6 - 20 mg/dL   Creatinine, Ser 0.95 0.61 - 1.24 mg/dL   Calcium 9.3 8.9 - 10.3 mg/dL   GFR calc non Af Amer >60 >60 mL/min   GFR calc Af Amer >60 >60 mL/min    Comment: (NOTE) The eGFR has been calculated using the CKD EPI equation. This calculation has not been validated in all clinical situations. eGFR's persistently <60 mL/min signify possible  Chronic Kidney Disease.    Anion gap 8 5 - 15  CBC     Status: Abnormal   Collection Time: 04/22/16  6:51 PM  Result Value Ref Range   WBC 13.0 (H) 3.8 - 10.6 K/uL   RBC 5.40 4.40 - 5.90 MIL/uL   Hemoglobin 17.0 13.0 - 18.0 g/dL   HCT 50.5 40.0 - 52.0 %   MCV 93.6 80.0 - 100.0 fL   MCH 31.5 26.0 - 34.0 pg   MCHC 33.7 32.0 - 36.0 g/dL   RDW 12.6 11.5 - 14.5 %   Platelets 232 150 - 440 K/uL  Ethanol     Status: None   Collection Time: 04/22/16  6:51 PM  Result Value Ref Range   Alcohol, Ethyl (B) <5 <5 mg/dL    Comment:        LOWEST DETECTABLE LIMIT FOR SERUM ALCOHOL IS 5 mg/dL FOR MEDICAL PURPOSES ONLY   Salicylate level     Status: None   Collection Time: 04/22/16  6:51 PM  Result Value Ref Range   Salicylate Lvl <0.9 2.8 - 30.0 mg/dL  Urinalysis complete, with microscopic     Status: Abnormal   Collection Time: 04/22/16  6:55 PM  Result Value Ref Range   Color, Urine YELLOW (A)  YELLOW   APPearance CLEAR (A) CLEAR   Glucose, UA NEGATIVE NEGATIVE mg/dL   Bilirubin Urine NEGATIVE NEGATIVE   Ketones, ur TRACE (A) NEGATIVE mg/dL   Specific Gravity, Urine 1.023 1.005 - 1.030   Hgb urine dipstick 2+ (A) NEGATIVE   pH 6.0 5.0 - 8.0   Protein, ur 30 (A) NEGATIVE mg/dL   Nitrite NEGATIVE NEGATIVE   Leukocytes, UA NEGATIVE NEGATIVE   RBC / HPF TOO NUMEROUS TO COUNT 0 - 5 RBC/hpf   WBC, UA 0-5 0 - 5 WBC/hpf   Bacteria, UA NONE SEEN NONE SEEN   Squamous Epithelial / LPF 0-5 (A) NONE SEEN   Mucous PRESENT   Urine Drug Screen, Qualitative ([facility] only)     Status: None   Collection Time: 04/22/16  6:55 PM  Result Value Ref Range   Tricyclic, Ur Screen NONE DETECTED NONE DETECTED   Amphetamines, Ur Screen NONE DETECTED NONE DETECTED   MDMA (Ecstasy)Ur Screen NONE DETECTED NONE DETECTED   Cocaine Metabolite,Ur [place] NONE DETECTED NONE DETECTED   Opiate, Ur Screen NONE DETECTED NONE DETECTED   Phencyclidine (PCP) Ur S NONE DETECTED NONE DETECTED   Cannabinoid 50 Ng, Ur [place] NONE DETECTED NONE DETECTED   Barbiturates, Ur Screen NONE DETECTED NONE DETECTED   Benzodiazepine, Ur Scrn NONE DETECTED NONE DETECTED   Methadone Scn, Ur NONE DETECTED NONE DETECTED    Comment: (NOTE) 643  Tricyclics, urine               Cutoff 1000 ng/mL 200  Amphetamines, urine             Cutoff 1000 ng/mL 300  MDMA (Ecstasy), urine           Cutoff 500 ng/mL 400  Cocaine Metabolite, urine       Cutoff 300 ng/mL 500  Opiate, urine                   Cutoff 300 ng/mL 600  Phencyclidine (PCP), urine      Cutoff 25 ng/mL 700  Cannabinoid, urine              Cutoff 50 ng/mL 800  Barbiturates, urine  Cutoff 200 ng/mL 900  Benzodiazepine, urine           Cutoff 200 ng/mL 1000 Methadone, urine                Cutoff 300 ng/mL 1100 1200 The urine drug screen provides only a preliminary, unconfirmed 1300 analytical test result and should not be used for non-medical 1400 purposes. Clinical  consideration and professional judgment should 1500 be applied to any positive drug screen result due to possible 1600 interfering substances. A more specific alternate chemical method 1700 must be used in order to obtain a confirmed analytical result.  1800 Gas chromato graphy / mass spectrometry (GC/MS) is the preferred 1900 confirmatory method.     No current facility-administered medications for this encounter.    Current Outpatient Prescriptions  Medication Sig Dispense Refill  . escitalopram (LEXAPRO) 20 MG tablet Take 20 mg by mouth daily.    . prazosin (MINIPRESS) 1 MG capsule Take 1 mg by mouth at bedtime.    Marland Kitchen amLODipine (NORVASC) 5 MG tablet Take 5 mg by mouth daily.    . nortriptyline (PAMELOR) 25 MG capsule Take 25 mg by mouth at bedtime.      Musculoskeletal: Strength & Muscle Tone: within normal limits Gait & Station: normal Patient leans: N/A  Psychiatric Specialty Exam: Physical Exam  Nursing note and vitals reviewed. Constitutional: He appears well-developed and well-nourished.  HENT:  Head: Normocephalic and atraumatic.  Eyes: Conjunctivae are normal. Pupils are equal, round, and reactive to light.  Neck: Normal range of motion.  Cardiovascular: Regular rhythm and normal heart sounds.   Respiratory: Effort normal. No respiratory distress.  GI: Soft.  Musculoskeletal: Normal range of motion.  Neurological: He is alert.  Skin: Skin is warm and dry.  Psychiatric: His speech is normal and behavior is normal. Judgment normal. Thought content is not paranoid. Cognition and memory are normal. He exhibits a depressed mood. He expresses no homicidal and no suicidal ideation.    Review of Systems  Constitutional: Positive for malaise/fatigue and weight loss.  HENT: Negative.   Eyes: Negative.   Respiratory: Negative.   Cardiovascular: Negative.   Gastrointestinal: Negative.   Musculoskeletal: Negative.   Skin: Negative.   Neurological: Positive for  dizziness.  Psychiatric/Behavioral: Positive for depression and substance abuse. Negative for hallucinations, memory loss and suicidal ideas. The patient is nervous/anxious and has insomnia.     Blood pressure (!) 144/92, pulse 79, temperature 98.3 F (36.8 C), temperature source Oral, resp. rate 18, height _0  (1.956 m), weight 108.9 kg (240 lb), SpO2 98 %.Body mass index is 28.46 kg/m.  General Appearance: Disheveled  Eye Contact:  Good  Speech:  Normal Rate  Volume:  Normal  Mood:  Dysphoric  Affect:  Appropriate  Thought Process:  Goal Directed  Orientation:  Full (Time, Place, and Person)  Thought Content:  Logical  Suicidal Thoughts:  No  Homicidal Thoughts:  No  Memory:  Immediate;   Good Recent;   Fair Remote;   Fair  Judgement:  Good  Insight:  Good  Psychomotor Activity:  Normal  Concentration:  Concentration: Fair  Recall:  [employer] of Knowledge:  Fair  Language:  Fair  Akathisia:  Negative  Handed:  Right  AIMS (if indicated):     Assets:  Communication Skills Desire for Improvement Financial Resources/Insurance Housing Physical Health Resilience  ADL's:  Intact  Cognition:  WNL  Sleep:        Treatment Plan Summary: Medication  management and Plan 35 year old man with major depression. No sign of psychosis. No sign of suicidality. Engaging in very appropriate management of his illness. He was prescribed medication at [facility] but has had orthostatic hypotension that is more than he can put up with. Reviewed his medicine. According to our reconciliation he's on Lexapro 20 mg a day and Minipress 1 mg at night. He denies that he is taking any nortriptyline. I told him that the Minipress was more likely the cause of his side effects but that the Lexapro was probably too high a dose to start with. My advice is that he cut the Lexapro dose in half to 10 mg, discontinue the Minipress, try using melatonin at night for sleep. Follow-up with [facility]. No need for hospital level  treatment. Patient agrees to the plan.  Disposition: Patient does not meet criteria for psychiatric inpatient admission. Supportive therapy provided about ongoing stressors. Discussed crisis plan, support from social network, calling 911, coming to the Emergency Department, and calling Suicide Hotline.  Alethia Berthold, MD 04/23/2016 12:47 PM

## 2016-04-23 NOTE — ED Notes (Signed)
TTS seen

## 2016-04-23 NOTE — ED Notes (Signed)
Pt will be informed that he also needs a dr visit with pcp to address possible bp issue

## 2016-04-23 NOTE — ED Notes (Signed)
Pt is alert and oriented on admission. Pt mood is sad/depressed and he is somewhat guarded. Pt forwards little to staff but is cooperative. Writer discussed tx plan and oriented pt to e BHU. 15 minute checks are ongoing for safety.

## 2016-04-23 NOTE — ED Provider Notes (Signed)
-----------------------------------------   1:00 PM on 04/23/2016 -----------------------------------------   Blood pressure (!) 144/92, pulse 79, temperature 98.3 F (36.8 C), temperature source Oral, resp. rate 18, height 6\' 5"  (1.956 m), weight 240 lb (108.9 kg), SpO2 98 %.  Patient evaluated by Dr.Clapacs and deemed appropriate for discharge. Dr. Toni Amendlapacs has made changes to patient's antidepressants. [facility]. Vital signs within normal limits. Blood work reviewed and within normal limits. Patient found to have hematuria. He is asymptomatic and has no urinary symptoms or flank pain. Patient is given the contact phone number for Dr. Vanna ScotlandAshley Brandon, urology for follow-up for further evaluation.   Nita Sicklearolina Dylan Ruotolo, MD 04/23/16 1301

## 2016-04-23 NOTE — ED Notes (Signed)
Pt sent home he has own vehicle

## 2016-04-23 NOTE — ED Notes (Signed)
Patient asleep in room. No noted distress or abnormal behavior. Will continue 15 minute checks and observation by security cameras for safety. 

## 2016-04-23 NOTE — BH Assessment (Signed)
Assessment Note  Malik Flores is an 35 y.o. male.   Patient came into the ED with complaints of medication side effects such as dizziness and blackout.  Patient recently started participating with [facility] for medication management.  Patient was scheduled for an appointment with [facility] today 9/26 but missed the appointment because of side effects.  Patient currently denies SI/HI, A/VH, and other self-injurious behaviors.  Patient denies patient history of inpatient hospitalizations or previous outpatient treatment.  Patient experienced the death of a child in 14-Dec-2013 and since that time his depression untreated has become out of control.  Patient reports currently having marital conflicts and wife is not as supportive.  Patient reports having difficulty sleeping at night only 2 hours for the past months.     Diagnosis: PTSD  Past Medical History:  Past Medical History:  Diagnosis Date  . Depression   . Hypertension   . PTSD (post-traumatic stress disorder)     History reviewed. No pertinent surgical history.  Family History:  Family History  Problem Relation Age of Onset  . Diabetes Mother   . Stroke Mother   . Hypertension Mother   . Diabetes Father   . Heart disease Father   . Lung disease Daughter     chronic  . Cancer Maternal Grandmother     Pancreatic  . Cancer Paternal Grandfather     Social History:  reports that he quit smoking about 9 years ago. He has never used smokeless tobacco. He reports that he does not drink alcohol. His drug history is not on file.  Additional Social History:  Alcohol / Drug Use Pain Medications: see chart Prescriptions: see chart Over the Counter: see chart History of alcohol / drug use?: No history of alcohol / drug abuse  CIWA: CIWA-Ar BP: (!) 128/100 Pulse Rate: 75 COWS:    Allergies: No Known Allergies  Home Medications:  (Not in a hospital admission)  OB/GYN Status:  No LMP for male patient.  General Assessment Data Location of  Assessment: [facility] ED TTS Assessment: In system Is this a Tele or Face-to-Face Assessment?: Face-to-Face Is this an Initial Assessment or a Re-assessment for this encounter?: Initial Assessment Marital status: Married Malik Flores name: na Is patient pregnant?: No Pregnancy Status: No Living Arrangements: Spouse/significant other, Children Can pt return to current living arrangement?: Yes Admission Status: Voluntary Is patient capable of signing voluntary admission?: Yes Referral Source: Self/Family/Friend Insurance type: none  Medical Screening Exam [facility] Walk-in ONLY) Medical Exam completed: Yes  Crisis Care Plan Living Arrangements: Spouse/significant other, Children Name of Psychiatrist: [facility] Name of Therapist: [facility]  Education Status Is patient currently in school?: No Current Grade: na Highest grade of school patient has completed: 12th Name of school: na Contact person: na  Risk to self with the past 6 months Suicidal Ideation: No-Not Currently/Within Last 6 Months Has patient been a risk to self within the past 6 months prior to admission? : No Suicidal Intent: No-Not Currently/Within Last 6 Months Has patient had any suicidal intent within the past 6 months prior to admission? : No Is patient at risk for suicide?: No Suicidal Plan?: No-Not Currently/Within Last 6 Months Has patient had any suicidal plan within the past 6 months prior to admission? : No Access to Means: No What has been your use of drugs/alcohol within the last 12 months?: na Previous Attempts/Gestures: No How many times?: 0 Other Self Harm Risks: na Triggers for Past Attempts:  (na) Intentional Self Injurious Behavior: None Family  Suicide History: No Recent stressful life event(s): Conflict (Comment), Loss (Comment), Trauma (Comment) (marital problems, loss of daughter) Persecutory voices/beliefs?: No Depression: Yes Depression Symptoms: Guilt, Isolating, Loss of interest in usual pleasures, Feeling  worthless/self pity, Feeling angry/irritable Substance abuse history and/or treatment for substance abuse?: No  Risk to Others within the past 6 months Homicidal Ideation: No-Not Currently/Within Last 6 Months Does patient have any lifetime risk of violence toward others beyond the six months prior to admission? : No Thoughts of Harm to Others: No-Not Currently Present/Within Last 6 Months Current Homicidal Intent: No-Not Currently/Within Last 6 Months Current Homicidal Plan: No-Not Currently/Within Last 6 Months Access to Homicidal Means: No Identified Victim: na History of harm to others?: No Assessment of Violence: None Noted Violent Behavior Description: none reported Does patient have access to weapons?: No Criminal Charges Pending?: No Does patient have a court date: No Is patient on probation?: No  Psychosis Hallucinations: None noted Delusions: None noted  Mental Status Report Appearance/Hygiene: In scrubs Eye Contact: Good Motor Activity: Freedom of movement Speech: Logical/coherent Level of Consciousness: Alert Mood: Sad, Depressed Affect: Sad, Anxious Anxiety Level: Minimal Thought Processes: Relevant, Coherent Judgement: Unimpaired Orientation: Person, Place, Time, Appropriate for developmental age, Situation Obsessive Compulsive Thoughts/Behaviors: None  Cognitive Functioning Concentration: Fair Memory: Recent Intact, Remote Intact IQ: Average Insight: Good Impulse Control: Good Appetite: Fair Weight Loss: 0 Weight Gain: 0 Sleep: Decreased Total Hours of Sleep: 2 Vegetative Symptoms: None  ADLScreening [facility]) Patient's cognitive ability adequate to safely complete daily activities?: Yes Patient able to express need for assistance with ADLs?: Yes Independently performs ADLs?: Yes (appropriate for developmental age)  Prior Inpatient Therapy Prior Inpatient Therapy: No  Prior Outpatient Therapy Prior Outpatient Therapy:  Yes Prior Therapy Dates: currently Prior Therapy Facilty/Provider(s): [facility] Reason for Treatment: Depression, PTSD Does patient have an ACCT team?: No Does patient have Intensive In-House Services?  : No Does patient have [facility] services? : No Does patient have P4CC services?: No  ADL Screening (condition at time of admission) Patient's cognitive ability adequate to safely complete daily activities?: Yes Patient able to express need for assistance with ADLs?: Yes Independently performs ADLs?: Yes (appropriate for developmental age)       Abuse/Neglect Assessment (Assessment to be complete while patient is alone) Physical Abuse: Denies Verbal Abuse: Denies Sexual Abuse: Denies Exploitation of patient/patient's resources: Denies Self-Neglect: Denies Values / Beliefs Cultural Requests During Hospitalization: None Spiritual Requests During Hospitalization: None Consults Spiritual Care Consult Needed: No Social Work Consult Needed: No Merchant navy officerAdvance Directives (For Healthcare) Does patient have an advance directive?: No    Additional Information 1:1 In Past 12 Months?: No CIRT Risk: No Elopement Risk: No Does patient have medical clearance?: Yes     Disposition:  Disposition Initial Assessment Completed for this Encounter: Yes Disposition of Patient: Other dispositions (pending) Other disposition(s): Other (Comment) (pending)  On Site Evaluation by:   Reviewed with Physician:    Maryelizabeth Rowanorbett, Trevionne Advani A 04/23/2016 12:38 AM

## 2016-04-23 NOTE — ED Notes (Signed)
Seen by Dr Toni Amendlapacs

## 2016-04-23 NOTE — ED Notes (Signed)
Pt states he has become more depressed since the death of his 363 yr old daughter from a congenital condition. He states his marriage has been having problems that his wife is gone a lot and leaves him with the children and he doesn't know where she is. He works full time as a Investment banker, operationalchef. He has 2 other children. He has not been sleeping and has had a poor appetite and has lost 15 lbs in the last month.He is pleasant and cooperative.

## 2016-04-23 NOTE — ED Notes (Addendum)
I  woke pt up due to phone call from family he states you can tell  Them I am here and I will call later and nothing else, he would not give  code #  Wife and sister called.

## 2016-04-23 NOTE — Discharge Instructions (Signed)
You have been seen in the Emergency Department (ED)  today for a psychiatric complaint.  You have been evaluated by psychiatry and we believe you are safe to be discharged from the hospital.   ° °Please return to the Emergency Department (ED)  immediately if you have ANY thoughts of hurting yourself or anyone else, so that we may help you. ° °Please avoid alcohol and drug use. ° °Follow up with your doctor and/or therapist as soon as possible regarding today's ED  visit.  ° °You may call crisis hotline for [place] at [phone]. ° °

## 2016-04-24 ENCOUNTER — Emergency Department: Payer: Self-pay

## 2016-04-24 ENCOUNTER — Emergency Department
Admission: EM | Admit: 2016-04-24 | Discharge: 2016-04-24 | Disposition: A | Discharge status: Home / Self Care | Payer: Self-pay | Attending: Emergency Medicine | Admitting: Emergency Medicine

## 2016-04-24 ENCOUNTER — Encounter: Payer: Self-pay | Admitting: Emergency Medicine

## 2016-04-24 DIAGNOSIS — R42 Dizziness and giddiness: Secondary | ICD-10-CM | POA: Insufficient documentation

## 2016-04-24 DIAGNOSIS — Z79899 Other long term (current) drug therapy: Secondary | ICD-10-CM | POA: Insufficient documentation

## 2016-04-24 DIAGNOSIS — I10 Essential (primary) hypertension: Secondary | ICD-10-CM | POA: Insufficient documentation

## 2016-04-24 DIAGNOSIS — Z87891 Personal history of nicotine dependence: Secondary | ICD-10-CM | POA: Insufficient documentation

## 2016-04-24 LAB — BASIC METABOLIC PANEL
ANION GAP: 7 (ref 5–15)
BUN: 17 mg/dL (ref 6–20)
CALCIUM: 9.2 mg/dL (ref 8.9–10.3)
CO2: 24 mmol/L (ref 22–32)
Chloride: 107 mmol/L (ref 101–111)
Creatinine, Ser: 0.96 mg/dL (ref 0.61–1.24)
Glucose, Bld: 109 mg/dL — ABNORMAL HIGH (ref 65–99)
POTASSIUM: 3.6 mmol/L (ref 3.5–5.1)
Sodium: 138 mmol/L (ref 135–145)

## 2016-04-24 LAB — CBC
HCT: 47.8 % (ref 40.0–52.0)
HEMOGLOBIN: 16.9 g/dL (ref 13.0–18.0)
MCH: 32.4 pg (ref 26.0–34.0)
MCHC: 35.3 g/dL (ref 32.0–36.0)
MCV: 91.7 fL (ref 80.0–100.0)
Platelets: 207 10*3/uL (ref 150–440)
RBC: 5.21 MIL/uL (ref 4.40–5.90)
RDW: 13.1 % (ref 11.5–14.5)
WBC: 9.7 10*3/uL (ref 3.8–10.6)

## 2016-04-24 MED ORDER — MECLIZINE HCL 25 MG PO TABS: 25.0000 mg | ORAL_TABLET | Freq: Three times a day (TID) | ORAL | 0 refills | Status: AC | PRN

## 2016-04-24 MED ORDER — MECLIZINE HCL 25 MG PO TABS
25.0000 mg | ORAL_TABLET | Freq: Once | ORAL | Status: AC
  Administered 2016-04-24: 25 mg via ORAL
  Filled 2016-04-24: qty 1

## 2016-04-24 NOTE — ED Notes (Signed)
Pt stating that he "fell out" today around 8:30 am. Pt stating that he has had dizziness for the last week. "feels like the room is spinning." Pt stating it is better while laying down. Pt denying HA and n/v. Pt in NAD at this time.

## 2016-04-24 NOTE — ED Provider Notes (Signed)
Time Seen: Approximately 1303 I have reviewed the triage notes  Chief Complaint: Dizziness   History of Present Illness: Malik Flores is a 35 y.o. male who presents with symptoms consistent with vertigo. Patient states that he was at work today and he states he felt off balance. He states he feels off balance whenever he gets up to ambulate. He denies any near syncopal symptoms to this historian. Patient was recently evaluated here in emergency department. Patient denies any fever and was afebrile on arrival. He denies any productive cough or wheezing. He states a mild headache with no neck stiffness or photophobia. He denies any chest pain or shortness of breath. Denies any ear pain or deafness.   Past Medical History:  Diagnosis Date  . Depression   . Hypertension   . PTSD (post-traumatic stress disorder)     Patient Active Problem List   Diagnosis Date Noted  . Severe major depression, single episode, without psychotic features (HCC) 04/23/2016  . Grief 04/23/2016  . Medication side effects 04/23/2016  . Orthostatic hypotension 04/23/2016  . Hypertension 01/25/2015  . Weight increase 01/25/2015  . Leucocytosis 01/25/2015  . Varicosities of leg 01/25/2015  . Insomnia 10/17/2014  . Migraines 10/17/2014    History reviewed. No pertinent surgical history.  History reviewed. No pertinent surgical history.  Current Outpatient Rx  . Order #: [id number] Class: Historical Med  . Order #: [id number] Class: Historical Med  . Order #: [id number] Class: Print  . Order #: [id number] Class: Historical Med  . Order #: [id number] Class: Historical Med    Allergies:  Review of patient's allergies indicates no known allergies.  Family History: Family History  Problem Relation Age of Onset  . Diabetes Mother   . Stroke Mother   . Hypertension Mother   . Diabetes Father   . Heart disease Father   . Lung disease Daughter     chronic  . Cancer Maternal Grandmother     Pancreatic   . Cancer Paternal Grandfather     Social History: Social History  Substance Use Topics  . Smoking status: Former Smoker    Quit date: 07/28/2006  . Smokeless tobacco: Never Used  . Alcohol use No     Review of Systems:   10 point review of systems was performed and was otherwise negative:  Constitutional: No fever Eyes: No visual disturbances ENT: No sore throat, ear pain Cardiac: No chest pain Respiratory: No shortness of breath, wheezing, or stridor Abdomen: No abdominal pain, no vomiting, No diarrhea Endocrine: No weight loss, No night sweats Extremities: No peripheral edema, cyanosis Skin: No rashes, easy bruising Neurologic: No focal weakness, trouble with speech or swollowing Urologic: No dysuria, Hematuria, or urinary frequency Patient denies any suicidal thoughts, homicidal thoughts, hallucinations and states he's been doing much better with his depression   Physical Exam:  ED Triage Vitals  Enc Vitals Group     BP 04/24/16 1006 (!) 130/97     Pulse Rate 04/24/16 1006 91     Resp 04/24/16 1006 18     Temp 04/24/16 1006 97.9 F (36.6 C)     Temp Source 04/24/16 1006 Oral     SpO2 04/24/16 1006 94 %     Weight 04/24/16 1009 240 lb (108.9 kg)     Height 04/24/16 1009 6\' 5"  (1.956 m)     Head Circumference --      Peak Flow --      Pain Score 04/24/16 1206 0     Pain  Loc --      Pain Edu? --      Excl. in GC? --     General: Awake , Alert , and Oriented times 3; GCS 15 Head: Normal cephalic , atraumatic Eyes: Pupils equal , round, reactive to light Nose/Throat: No nasal drainage, patent upper airway without erythema or exudate.  Neck: Supple, Full range of motion, No anterior adenopathy or palpable thyroid masses Lungs: Clear to ascultation without wheezes , rhonchi, or rales Heart: Regular rate, regular rhythm without murmurs , gallops , or rubs Abdomen: Soft, non tender without rebound, guarding , or rigidity; bowel sounds positive and symmetric in all  4 quadrants. No organomegaly .        Extremities: 2 plus symmetric pulses. No edema, clubbing or cyanosis Neurologic: normal ambulation, Motor symmetric without deficits, sensory intact Skin: warm, dry, no rashes   Labs:   All laboratory work was reviewed including any pertinent negatives or positives listed below:  Labs Reviewed  BASIC METABOLIC PANEL - Abnormal; Notable for the following:       Result Value   Glucose, Bld 109 (*)    All other components within normal limits  CBC  Laboratory work was reviewed and showed no clinically significant abnormalities.   EKG: ED ECG REPORT I, Jennye Moccasin, the attending physician, personally viewed and interpreted this ECG.  Date: 04/24/2016 EKG Time: 1008 Rate: 80 Rhythm: normal sinus rhythm QRS Axis: normal Intervals: normal ST/T Wave abnormalities: normal Conduction Disturbances: none Narrative Interpretation: unremarkable No acute ischemic changes   Radiology:  "Ct Head Wo Contrast  Result Date: 04/24/2016 CLINICAL DATA:  Head injury after fall at work. EXAM: CT HEAD WITHOUT CONTRAST TECHNIQUE: Contiguous axial images were obtained from the base of the skull through the vertex without intravenous contrast. COMPARISON:  None. FINDINGS: Brain: No mass effect or midline shift is noted. Ventricular size is within normal limits. There is no evidence of mass lesion, hemorrhage or acute infarction. Vascular: No abnormality seen. Skull: Bony calvarium appears intact. Sinuses/Orbits: Visualized paranasal sinuses appear normal. Other: None. IMPRESSION: Normal head CT. Electronically Signed   By: Lupita Raider, M.D.   On: 04/24/2016 14:16  "   I personally reviewed the radiologic studies    ED Course:  Patient's stay here was uneventful and otherwise is hemodynamically stable. I was not aware of his low-grade fever on the recheck of his temperature. He certainly doesn't exhibit any symptoms consistent with meningitis,  encephalitis, community-acquired pneumonia, etc. They're to be dehydrated and his laboratory work is normal and he states his depression seems to be doing well. I felt the patient could be treated on an outpatient basis and his symptoms seem to be consistent with vertigo as opposed to near syncope. Patient was given a work note and advised to go home and drink plenty of fluids and return here for any new concerns. Clinical Course     Assessment: * Benign positional vertigo   Final Clinical Impression: *  Final diagnoses:  Vertigo     Plan:  Outpatient " Discharge Medication List as of 04/24/2016  2:57 PM    START taking these medications   Details  meclizine (ANTIVERT) 25 MG tablet Take 1 tablet (25 mg total) by mouth 3 (three) times daily as needed for dizziness or nausea., Starting Thu 04/24/2016, Print      " Patient was advised to return immediately if condition worsens. Patient was advised to follow up with their primary care physician  or other specialized physicians involved in their outpatient care. The patient and/or family member/power of attorney had laboratory results reviewed at the bedside. All questions and concerns were addressed and appropriate discharge instructions were distributed by the nursing staff.            Jennye Moccasin, MD 04/24/16 [phone]

## 2016-04-24 NOTE — ED Triage Notes (Signed)
Pt was seen two days ago for the same symptoms, pt complains of dizziness and generalized weakness, pt became weak at work and sat on floor, no LOC

## 2016-04-26 ENCOUNTER — Encounter: Payer: Self-pay | Admitting: Emergency Medicine

## 2016-04-26 ENCOUNTER — Emergency Department: Payer: Self-pay

## 2016-04-26 ENCOUNTER — Emergency Department
Admission: EM | Admit: 2016-04-26 | Discharge: 2016-04-26 | Disposition: A | Discharge status: Home / Self Care | Payer: Self-pay | Attending: Emergency Medicine | Admitting: Emergency Medicine

## 2016-04-26 DIAGNOSIS — N200 Calculus of kidney: Secondary | ICD-10-CM | POA: Insufficient documentation

## 2016-04-26 DIAGNOSIS — Z87891 Personal history of nicotine dependence: Secondary | ICD-10-CM | POA: Insufficient documentation

## 2016-04-26 DIAGNOSIS — Z79899 Other long term (current) drug therapy: Secondary | ICD-10-CM | POA: Insufficient documentation

## 2016-04-26 DIAGNOSIS — I10 Essential (primary) hypertension: Secondary | ICD-10-CM | POA: Insufficient documentation

## 2016-04-26 DIAGNOSIS — R109 Unspecified abdominal pain: Secondary | ICD-10-CM

## 2016-04-26 LAB — COMPREHENSIVE METABOLIC PANEL
ALBUMIN: 4.4 g/dL (ref 3.5–5.0)
ALK PHOS: 92 U/L (ref 38–126)
ALT: 30 U/L (ref 17–63)
ANION GAP: 6 (ref 5–15)
AST: 22 U/L (ref 15–41)
BUN: 12 mg/dL (ref 6–20)
CALCIUM: 8.8 mg/dL — AB (ref 8.9–10.3)
CO2: 23 mmol/L (ref 22–32)
CREATININE: 0.81 mg/dL (ref 0.61–1.24)
Chloride: 109 mmol/L (ref 101–111)
GFR calc Af Amer: >60 mL/min (ref >60)
GFR calc non Af Amer: >60 mL/min (ref >60)
Glucose, Bld: 127 mg/dL — ABNORMAL HIGH (ref 65–99)
POTASSIUM: 3.6 mmol/L (ref 3.5–5.1)
SODIUM: 138 mmol/L (ref 135–145)
TOTAL PROTEIN: 7.5 g/dL (ref 6.5–8.1)
Total Bilirubin: 0.5 mg/dL (ref 0.3–1.2)

## 2016-04-26 LAB — CBC
HCT: 48.9 % (ref 40.0–52.0)
HEMOGLOBIN: 16.6 g/dL (ref 13.0–18.0)
MCH: 31.6 pg (ref 26.0–34.0)
MCHC: 33.8 g/dL (ref 32.0–36.0)
MCV: 93.3 fL (ref 80.0–100.0)
Platelets: 208 10*3/uL (ref 150–440)
RBC: 5.24 MIL/uL (ref 4.40–5.90)
RDW: 12.8 % (ref 11.5–14.5)
WBC: 8.8 10*3/uL (ref 3.8–10.6)

## 2016-04-26 LAB — URINALYSIS COMPLETE WITH MICROSCOPIC (ARMC ONLY)
Bacteria, UA: NONE SEEN
Bilirubin Urine: NEGATIVE
GLUCOSE, UA: NEGATIVE mg/dL
KETONES UR: NEGATIVE mg/dL
Leukocytes, UA: NEGATIVE
NITRITE: NEGATIVE
Protein, ur: 100 mg/dL — AB
SPECIFIC GRAVITY, URINE: 1.024 (ref 1.005–1.030)
pH: 6 (ref 5.0–8.0)

## 2016-04-26 LAB — LIPASE, BLOOD: LIPASE: 17 U/L (ref 11–51)

## 2016-04-26 MED ORDER — ONDANSETRON HCL 4 MG PO TABS: 4.0000 mg | ORAL_TABLET | Freq: Every day | ORAL | 1 refills | Status: AC | PRN

## 2016-04-26 MED ORDER — TAMSULOSIN HCL 0.4 MG PO CAPS: 0.4000 mg | ORAL_CAPSULE | Freq: Every day | ORAL | 0 refills | Status: DC

## 2016-04-26 MED ORDER — OXYCODONE-ACETAMINOPHEN 5-325 MG PO TABS: 1.0000 | ORAL_TABLET | Freq: Four times a day (QID) | ORAL | 0 refills | Status: AC | PRN

## 2016-04-26 MED ORDER — NAPROXEN 500 MG PO TABS: 500.0000 mg | ORAL_TABLET | Freq: Two times a day (BID) | ORAL | 2 refills | Status: AC

## 2016-04-26 MED ORDER — KETOROLAC TROMETHAMINE 30 MG/ML IJ SOLN
30.0000 mg | Freq: Once | INTRAMUSCULAR | Status: AC
  Administered 2016-04-26: 30 mg via INTRAVENOUS
  Filled 2016-04-26: qty 1

## 2016-04-26 MED ORDER — MORPHINE SULFATE (PF) 4 MG/ML IV SOLN
4.0000 mg | Freq: Once | INTRAVENOUS | Status: AC
Start: 2016-04-26 — End: 2016-04-26
  Administered 2016-04-26: 4 mg via INTRAVENOUS
  Filled 2016-04-26: qty 1

## 2016-04-26 MED ORDER — ONDANSETRON HCL 4 MG/2ML IJ SOLN
4.0000 mg | Freq: Once | INTRAMUSCULAR | Status: AC
  Administered 2016-04-26: 4 mg via INTRAVENOUS
  Filled 2016-04-26: qty 2

## 2016-04-26 NOTE — ED Provider Notes (Signed)
[facility] Emergency Department Provider Note   ____________________________________________    I have reviewed the triage vital signs and the nursing notes.   HISTORY  Chief Complaint Abdominal Pain and Flank Pain     HPI Malik Flores is a 35 y.o. male who presents with complaints of right lower quadrant abdominal pain. Patient reports the pain started this morning when he woke up. He describes the pain as starting in his right lower quadrant and radiating to his right flank, he describes it as severe and sharp. He has never had it before. He has never had abdominal surgery before. He denies nausea or vomiting. No fevers or chills. No dysuria. Notably the patient has been seen twice before in the past week.   Past Medical History:  Diagnosis Date  . Depression   . Hypertension   . PTSD (post-traumatic stress disorder)     Patient Active Problem List   Diagnosis Date Noted  . Severe major depression, single episode, without psychotic features (HCC) 04/23/2016  . Grief 04/23/2016  . Medication side effects 04/23/2016  . Orthostatic hypotension 04/23/2016  . Hypertension 01/25/2015  . Weight increase 01/25/2015  . Leucocytosis 01/25/2015  . Varicosities of leg 01/25/2015  . Insomnia 10/17/2014  . Migraines 10/17/2014    History reviewed. No pertinent surgical history.  Prior to Admission medications   Medication Sig Start Date End Date Taking? Authorizing Provider  amLODipine (NORVASC) 5 MG tablet Take 5 mg by mouth daily.    Historical Provider, MD  escitalopram (LEXAPRO) 20 MG tablet Take 20 mg by mouth daily.    Historical Provider, MD  meclizine (ANTIVERT) 25 MG tablet Take 1 tablet (25 mg total) by mouth 3 (three) times daily as needed for dizziness or nausea. 04/24/16   Jennye MoccasinBrian S Quigley, MD  naproxen (NAPROSYN) 500 MG tablet Take 1 tablet (500 mg total) by mouth 2 (two) times daily with a meal. 04/26/16   Jene Everyobert Misty Foutz, MD    nortriptyline (PAMELOR) 25 MG capsule Take 25 mg by mouth at bedtime.    Historical Provider, MD  ondansetron (ZOFRAN) 4 MG tablet Take 1 tablet (4 mg total) by mouth daily as needed for nausea or vomiting. 04/26/16   Jene Everyobert Jerren Flinchbaugh, MD  oxyCODONE-acetaminophen (ROXICET) 5-325 MG tablet Take 1 tablet by mouth every 6 (six) hours as needed. 04/26/16 04/26/17  Jene Everyobert Niza Soderholm, MD  prazosin (MINIPRESS) 1 MG capsule Take 1 mg by mouth at bedtime.    Historical Provider, MD  tamsulosin (FLOMAX) 0.4 MG CAPS capsule Take 1 capsule (0.4 mg total) by mouth daily. 04/26/16   Jene Everyobert Geraldine Sandberg, MD     Allergies Review of patient's allergies indicates no known allergies.  Family History  Problem Relation Age of Onset  . Diabetes Mother   . Stroke Mother   . Hypertension Mother   . Diabetes Father   . Heart disease Father   . Lung disease Daughter     chronic  . Cancer Maternal Grandmother     Pancreatic  . Cancer Paternal Grandfather     Social History Social History  Substance Use Topics  . Smoking status: Former Smoker    Quit date: 07/28/2006  . Smokeless tobacco: Never Used  . Alcohol use No    Review of Systems  Constitutional: No fever/chills  Cardiovascular: Denies chest pain. Respiratory: Denies Cough Gastrointestinal: No abdominal pain.  No nausea, no vomiting.  As above Genitourinary: Negative for dysuria. Negative for hematuria Musculoskeletal: Negative for  back pain. Skin: Negative for rash. Neurological: Negative for headaches or weakness  10-point ROS otherwise negative.  ____________________________________________   PHYSICAL EXAM:  VITAL SIGNS: ED Triage Vitals  Enc Vitals Group     BP 04/26/16 1229 (!) 143/103     Pulse Rate 04/26/16 1229 84     Resp 04/26/16 1229 (!) 25     Temp 04/26/16 1229 97.3 F (36.3 C)     Temp Source 04/26/16 1229 Oral     SpO2 04/26/16 1229 95 %     Weight 04/26/16 1230 240 lb (108.9 kg)     Height 04/26/16 1230 6\' 5"  (1.956 m)      Head Circumference --      Peak Flow --      Pain Score 04/26/16 1230 10     Pain Loc --      Pain Edu? --      Excl. in GC? --     Constitutional: Alert and oriented. No acute distress. Anxious, appears overly dramatic Eyes: Conjunctivae are normal.   Nose: Positive rhinorrhea   Cardiovascular: Normal rate, regular rhythm. Grossly normal heart sounds.  Good peripheral circulation. Respiratory: Normal respiratory effort.  No retractions. Lungs CTAB. Gastrointestinal: Mild tenderness to palpation in right lower quadrant. No distention.  No CVA tenderness. Genitourinary: deferred Musculoskeletal: No lower extremity tenderness nor edema.  Warm and well perfused Neurologic:  Normal speech and language. No gross focal neurologic deficits are appreciated.  Skin:  Skin is warm, dry and intact. No rash noted.   ____________________________________________   LABS (all labs ordered are listed, but only abnormal results are displayed)  Labs Reviewed  COMPREHENSIVE METABOLIC PANEL - Abnormal; Notable for the following:       Result Value   Glucose, Bld 127 (*)    Calcium 8.8 (*)    All other components within normal limits  URINALYSIS COMPLETEWITH MICROSCOPIC ([facility] ONLY) - Abnormal; Notable for the following:    Color, Urine YELLOW (*)    APPearance HAZY (*)    Hgb urine dipstick 3+ (*)    Protein, ur 100 (*)    Squamous Epithelial / LPF 0-5 (*)    All other components within normal limits  CBC  LIPASE, BLOOD   ____________________________________________  EKG  ED ECG REPORT I, Jene Every, the attending physician, personally viewed and interpreted this ECG.  Date: 04/26/2016 EKG Time: 12:31 PM Rate: 73 Rhythm: normal sinus rhythm QRS Axis: normal Intervals: normal ST/T Wave abnormalities: normal Conduction Disturbances: none   ____________________________________________  RADIOLOGY  6 mm right  stone ____________________________________________   PROCEDURES  Procedure(s) performed: No    Critical Care performed: No ____________________________________________   INITIAL IMPRESSION / ASSESSMENT AND PLAN / ED COURSE  Pertinent labs & imaging results that were available during my care of the patient were reviewed by me and considered in my medical decision making (see chart for details).  Patient presents with abrupt onset right lower quadrant/right flank pain. Kidney stone and appendicitis on the differential but it is also notable that the patient has returned to the ED for the third time this week. There may be a strong component of anxiety. We will obtain labs, urine, CT renal stone study and reevaluate.  Clinical Course  CT scan consistent with ureterolithiasis, patient received IV Toradol and IV morphine and IV Zofran with complete resolution of pain. Urinalysis is reassuring, no evidence of infection. Afebrile, normal white blood cell count, normal urine.  Given the patient is pain-free  feel we can give him a trial to see the contrast for stone, heis to follow-up closely with urology and to return to the emergency Department if any worsening pain, fever, inability to take pills. ____________________________________________   FINAL CLINICAL IMPRESSION(S) / ED DIAGNOSES  Final diagnoses:  Right flank pain  Kidney stone      NEW MEDICATIONS STARTED DURING THIS VISIT:  New Prescriptions   NAPROXEN (NAPROSYN) 500 MG TABLET    Take 1 tablet (500 mg total) by mouth 2 (two) times daily with a meal.   ONDANSETRON (ZOFRAN) 4 MG TABLET    Take 1 tablet (4 mg total) by mouth daily as needed for nausea or vomiting.   OXYCODONE-ACETAMINOPHEN (ROXICET) 5-325 MG TABLET    Take 1 tablet by mouth every 6 (six) hours as needed.   TAMSULOSIN (FLOMAX) 0.4 MG CAPS CAPSULE    Take 1 capsule (0.4 mg total) by mouth daily.     Note:  This document was prepared using Dragon voice  recognition software and may include unintentional dictation errors.    Jene Every, MD 04/26/16 [phone]

## 2016-04-26 NOTE — ED Triage Notes (Signed)
Pt presents to ED c/o abdominal pain starting on RLQ and wrapping around to R flank.

## 2016-04-26 NOTE — ED Triage Notes (Signed)
Pt states pain started this morning and has gotten progressively worse.

## 2016-05-10 ENCOUNTER — Encounter: Payer: Self-pay | Admitting: Emergency Medicine

## 2016-05-10 ENCOUNTER — Emergency Department: Payer: Self-pay

## 2016-05-10 ENCOUNTER — Emergency Department
Admission: EM | Admit: 2016-05-10 | Discharge: 2016-05-10 | Disposition: A | Discharge status: Home / Self Care | Payer: Self-pay | Attending: Emergency Medicine | Admitting: Emergency Medicine

## 2016-05-10 DIAGNOSIS — Z79899 Other long term (current) drug therapy: Secondary | ICD-10-CM | POA: Insufficient documentation

## 2016-05-10 DIAGNOSIS — N202 Calculus of kidney with calculus of ureter: Secondary | ICD-10-CM | POA: Insufficient documentation

## 2016-05-10 DIAGNOSIS — N200 Calculus of kidney: Secondary | ICD-10-CM

## 2016-05-10 DIAGNOSIS — Z87891 Personal history of nicotine dependence: Secondary | ICD-10-CM | POA: Insufficient documentation

## 2016-05-10 DIAGNOSIS — I10 Essential (primary) hypertension: Secondary | ICD-10-CM | POA: Insufficient documentation

## 2016-05-10 DIAGNOSIS — Z791 Long term (current) use of non-steroidal anti-inflammatories (NSAID): Secondary | ICD-10-CM | POA: Insufficient documentation

## 2016-05-10 LAB — URINALYSIS COMPLETE WITH MICROSCOPIC (ARMC ONLY)
Bacteria, UA: NONE SEEN
Bilirubin Urine: NEGATIVE
GLUCOSE, UA: NEGATIVE mg/dL
KETONES UR: NEGATIVE mg/dL
Leukocytes, UA: NEGATIVE
Nitrite: NEGATIVE
PROTEIN: 100 mg/dL — AB
SQUAMOUS EPITHELIAL / LPF: NONE SEEN
Specific Gravity, Urine: 1.019 (ref 1.005–1.030)
pH: 6 (ref 5.0–8.0)

## 2016-05-10 LAB — CBC
HEMATOCRIT: 46.3 % (ref 40.0–52.0)
HEMOGLOBIN: 15.7 g/dL (ref 13.0–18.0)
MCH: 31.7 pg (ref 26.0–34.0)
MCHC: 33.9 g/dL (ref 32.0–36.0)
MCV: 93.4 fL (ref 80.0–100.0)
Platelets: 233 10*3/uL (ref 150–440)
RBC: 4.96 MIL/uL (ref 4.40–5.90)
RDW: 12.7 % (ref 11.5–14.5)
WBC: 12.9 10*3/uL — AB (ref 3.8–10.6)

## 2016-05-10 LAB — BASIC METABOLIC PANEL
ANION GAP: 8 (ref 5–15)
BUN: 20 mg/dL (ref 6–20)
CO2: 23 mmol/L (ref 22–32)
Calcium: 8.8 mg/dL — ABNORMAL LOW (ref 8.9–10.3)
Chloride: 107 mmol/L (ref 101–111)
Creatinine, Ser: 1.38 mg/dL — ABNORMAL HIGH (ref 0.61–1.24)
GFR calc Af Amer: >60 mL/min (ref >60)
Glucose, Bld: 101 mg/dL — ABNORMAL HIGH (ref 65–99)
POTASSIUM: 4.1 mmol/L (ref 3.5–5.1)
SODIUM: 138 mmol/L (ref 135–145)

## 2016-05-10 MED ORDER — TAMSULOSIN HCL 0.4 MG PO CAPS: 0.4000 mg | ORAL_CAPSULE | Freq: Every day | ORAL | 0 refills | Status: AC

## 2016-05-10 MED ORDER — SODIUM CHLORIDE 0.9 % IV BOLUS (SEPSIS): 1000.0000 mL | Freq: Once | INTRAVENOUS | Status: DC

## 2016-05-10 MED ORDER — IBUPROFEN 800 MG PO TABS: 800.0000 mg | ORAL_TABLET | Freq: Three times a day (TID) | ORAL | 0 refills | Status: AC | PRN

## 2016-05-10 MED ORDER — KETOROLAC TROMETHAMINE 30 MG/ML IJ SOLN
30.0000 mg | Freq: Once | INTRAMUSCULAR | Status: DC
  Filled 2016-05-10: qty 1

## 2016-05-10 NOTE — ED Triage Notes (Signed)
Hx of kidney stones recently  Noticed blood in urine today  With intermittent sharp pain

## 2016-05-10 NOTE — Discharge Instructions (Signed)
You have been seen in the Emergency Department (ED)  Today and was diagnosed with kidney stones. While the stone is traveling through the ureter, which is the tube that carries urine from the kidney to the bladder, you will probably feel pain. The pain may be mild or very severe. You may also have some blood in your urine. As soon as the stone reaches the bladder, any intense pain should go away. If a stone is too large to pass on its own, you may need a medical procedure to help you pass the stone.   As we have discussed, please drink plenty of fluids and use a urinary strainer to attempt to capture the stone.  Please make a follow up appointment with Urology in the next week by calling the number below and bring the stone with you.  Take ibuprofen 800mg  every 6 hours for the pain. Please also take your prescribed flomax daily. Check with your doctor if you have a history of gastritis, stomach ulcers, renal failure or impaired kidney function as you may not be able to take ibuprofen/ motrin. Your doctor can give you a different prescription for pain control.  Follow-up with your doctor or return to the ER in 12-24 hours if your pain is not well controlled, if you develop pain or burning with urination, or if you develop a fever. Otherwise follow up in 3-5 days with your doctor.  When should you call for help?  Call your doctor now or seek immediate medical care if:  You cannot keep down fluids.  Your pain gets worse.  You have a fever or chills.  You have new or worse pain in your back just below your rib cage (the flank area).  You have new or more blood in your urine. You have pain or burning with urination You are unable to urinate You have abdominal pain  Watch closely for changes in your health, and be sure to contact your doctor if:  You do not get better as expected  How can you care for yourself at home?  Drink plenty of fluids, enough so that your urine is light yellow or clear like  water. If you have kidney, heart, or liver disease and have to limit fluids, talk with your doctor before you increase the amount of fluids you drink.  Take pain medicines exactly as directed. Call your doctor if you think you are having a problem with your medicine.  If the doctor gave you a prescription medicine for pain, take it as prescribed.  If you are not taking a prescription pain medicine, ask your doctor if you can take an over-the-counter medicine. Read and follow all instructions on the label. Your doctor may ask you to strain your urine so that you can collect your kidney stone when it passes. You can use a kitchen strainer or a tea strainer to catch the stone. Store it in a plastic bag until you see your doctor again.  Preventing future kidney stones  Some changes in your diet may help prevent kidney stones. Depending on the cause of your stones, your doctor may recommend that you:  Drink plenty of fluids, enough so that your urine is light yellow or clear like water. If you have kidney, heart, or liver disease and have to limit fluids, talk with your doctor before you increase the amount of fluids you drink.  Limit coffee, tea, and alcohol. Also avoid grapefruit juice.  Do not take more than the recommended  daily dose of vitamins C and D.  Avoid antacids such as Gaviscon, Maalox, Mylanta, or Tums.  Limit the amount of salt (sodium) in your diet.  Eat a balanced diet that is not too high in protein.  Limit foods that are high in a substance called oxalate, which can cause kidney stones. These foods include dark green vegetables, rhubarb, chocolate, wheat bran, nuts, cranberries, and beans.

## 2016-05-10 NOTE — ED Provider Notes (Signed)
[facility] Emergency Department Provider Note  ____________________________________________  Time seen: Approximately 7:55 PM  I have reviewed the triage vital signs and the nursing notes.   HISTORY  Chief Complaint Hematuria   HPI Malik Flores is a 35 y.o. male with history of kidney stones, depression, hypertension, PTSD who presents for evaluation of hematuria. Patient was seen here on 04/26/16 with right lower quadrant abdominal pain. Had a CT scan that showed a 6 mm stone in the right proximal ureter with mild hydronephrosis. Patient was sent home on medication for pain. He reports that a few days later his pain is completely resolved. 3 days ago he reports that he started having intermittent mild right flank pain radiating to his groin. He reports that the pain is very mild, 1 or 2 in intensity. Today he noticed gross hematuria which prompted his visit to the emergency room. Currently endorses mild right flank pain. No nausea vomiting, no dysuria, no chills, no abdominal pain.  Past Medical History:  Diagnosis Date  . Depression   . Hypertension   . PTSD (post-traumatic stress disorder)     Patient Active Problem List   Diagnosis Date Noted  . Severe major depression, single episode, without psychotic features (HCC) 04/23/2016  . Grief 04/23/2016  . Medication side effects 04/23/2016  . Orthostatic hypotension 04/23/2016  . Hypertension 01/25/2015  . Weight increase 01/25/2015  . Leucocytosis 01/25/2015  . Varicosities of leg 01/25/2015  . Insomnia 10/17/2014  . Migraines 10/17/2014    History reviewed. No pertinent surgical history.  Prior to Admission medications   Medication Sig Start Date End Date Taking? Authorizing Provider  amLODipine (NORVASC) 5 MG tablet Take 5 mg by mouth daily.    Historical Provider, MD  escitalopram (LEXAPRO) 20 MG tablet Take 20 mg by mouth daily.    Historical Provider, MD  ibuprofen (ADVIL,MOTRIN) 800  MG tablet Take 1 tablet (800 mg total) by mouth every 8 (eight) hours as needed. 05/10/16   Nita Sickle, MD  meclizine (ANTIVERT) 25 MG tablet Take 1 tablet (25 mg total) by mouth 3 (three) times daily as needed for dizziness or nausea. 04/24/16   Jennye Moccasin, MD  naproxen (NAPROSYN) 500 MG tablet Take 1 tablet (500 mg total) by mouth 2 (two) times daily with a meal. 04/26/16   Jene Every, MD  nortriptyline (PAMELOR) 25 MG capsule Take 25 mg by mouth at bedtime.    Historical Provider, MD  ondansetron (ZOFRAN) 4 MG tablet Take 1 tablet (4 mg total) by mouth daily as needed for nausea or vomiting. 04/26/16   Jene Every, MD  oxyCODONE-acetaminophen (ROXICET) 5-325 MG tablet Take 1 tablet by mouth every 6 (six) hours as needed. 04/26/16 04/26/17  Jene Every, MD  prazosin (MINIPRESS) 1 MG capsule Take 1 mg by mouth at bedtime.    Historical Provider, MD  tamsulosin (FLOMAX) 0.4 MG CAPS capsule Take 1 capsule (0.4 mg total) by mouth daily. 05/10/16   Nita Sickle, MD    Allergies Review of patient's allergies indicates no known allergies.  Family History  Problem Relation Age of Onset  . Diabetes Mother   . Stroke Mother   . Hypertension Mother   . Diabetes Father   . Heart disease Father   . Lung disease Daughter     chronic  . Cancer Maternal Grandmother     Pancreatic  . Cancer Paternal Grandfather     Social History Social History  Substance Use Topics  .  Smoking status: Former Smoker    Quit date: 07/28/2006  . Smokeless tobacco: Never Used  . Alcohol use No    Review of Systems  Constitutional: Negative for fever. Eyes: Negative for visual changes. ENT: Negative for sore throat. Cardiovascular: Negative for chest pain. Respiratory: Negative for shortness of breath. Gastrointestinal: Negative for abdominal pain, vomiting or diarrhea. Genitourinary: Negative for dysuria. + hematuria and R flank pain Musculoskeletal: Negative for back pain. Skin: Negative  for rash. Neurological: Negative for headaches, weakness or numbness.  ____________________________________________   PHYSICAL EXAM:  VITAL SIGNS: ED Triage Vitals  Enc Vitals Group     BP 05/10/16 1901 (!) 157/102     Pulse Rate 05/10/16 1901 88     Resp 05/10/16 1901 20     Temp 05/10/16 1901 97.8 F (36.6 C)     Temp Source 05/10/16 1901 Oral     SpO2 05/10/16 1901 98 %     Weight 05/10/16 1901 240 lb (108.9 kg)     Height 05/10/16 1901 6\' 4"  (1.93 m)     Head Circumference --      Peak Flow --      Pain Score 05/10/16 1902 3     Pain Loc --      Pain Edu? --      Excl. in GC? --     Constitutional: Alert and oriented. Well appearing and in no apparent distress. HEENT:      Head: Normocephalic and atraumatic.         Eyes: Conjunctivae are normal. Sclera is non-icteric. EOMI. PERRL      Mouth/Throat: Mucous membranes are moist.       Neck: Supple with no signs of meningismus. Cardiovascular: Regular rate and rhythm. No murmurs, gallops, or rubs. 2+ symmetrical distal pulses are present in all extremities. No JVD. Respiratory: Normal respiratory effort. Lungs are clear to auscultation bilaterally. No wheezes, crackles, or rhonchi.  Gastrointestinal: Soft, non tender, and non distended with positive bowel sounds. No rebound or guarding. Genitourinary: No CVA tenderness. Musculoskeletal: Nontender with normal range of motion in all extremities. No edema, cyanosis, or erythema of extremities. Neurologic: Normal speech and language. Face is symmetric. Moving all extremities. No gross focal neurologic deficits are appreciated. Skin: Skin is warm, dry and intact. No rash noted. Psychiatric: Mood and affect are normal. Speech and behavior are normal.  ____________________________________________   LABS (all labs ordered are listed, but only abnormal results are displayed)  Labs Reviewed  URINALYSIS COMPLETEWITH MICROSCOPIC ([facility] ONLY) - Abnormal; Notable for the  following:       Result Value   Color, Urine RED (*)    APPearance CLOUDY (*)    Hgb urine dipstick 3+ (*)    Protein, ur 100 (*)    All other components within normal limits  BASIC METABOLIC PANEL - Abnormal; Notable for the following:    Glucose, Bld 101 (*)    Creatinine, Ser 1.38 (*)    Calcium 8.8 (*)    All other components within normal limits  CBC - Abnormal; Notable for the following:    WBC 12.9 (*)    All other components within normal limits  URINE CULTURE   ____________________________________________  EKG  none ____________________________________________  RADIOLOGY  CT renal:  Increasing moderate right hydronephrosis caused by a 6 mm mid right ureteral calculus, which has progressed from the proximal ureter seen on the prior study. Adjacent 2 mm calculus in the mid right ureter.  Punctate nonobstructing right  lower pole renal calculus. ____________________________________________   PROCEDURES  Procedure(s) performed: None Procedures Critical Care performed:  None ____________________________________________   INITIAL IMPRESSION / ASSESSMENT AND PLAN / ED COURSE   35 y.o. male with history of kidney stones, depression, hypertension, PTSD who presents for evaluation of hematuria since earlier today and mild intermittent R flank pain x 3 days. Patient is well-appearing, in no distress, has normal vital signs, abdomen is soft and nontender, no flank tenderness. Urinalysis showing 3+ hemoglobin, negative nitrites, negative leukocytes, negative bacteria. Creatinine slightly elevated at 1.38 (baseline is 0.9). Hemoglobin stable at 15.7. CT renal was performed as patient wasn't having significant pain but had new onset of hematuria. CT shows increased moderate right hydronephrosis caused by the 6 mm stone that has now moved to the mid ureter and also a 2 mm calculus in the mid right ureter. Patient's pain is extremely well controlled. UA with no evidence of  urinary tract infection. Will dc home with follow up with Urology. Discussed return precautions with patient for signs of severe anemia such as dizziness, chest pain, shortness of breath, fatigue and also signs and symptoms of superimposed urinary tract infection. Recommend patient return to the emergency room if these develop. Patient is in agreement.    Clinical Course    Pertinent labs & imaging results that were available during my care of the patient were reviewed by me and considered in my medical decision making (see chart for details).    ____________________________________________   FINAL CLINICAL IMPRESSION(S) / ED DIAGNOSES  Final diagnoses:  Kidney stone      NEW MEDICATIONS STARTED DURING THIS VISIT:  New Prescriptions   IBUPROFEN (ADVIL,MOTRIN) 800 MG TABLET    Take 1 tablet (800 mg total) by mouth every 8 (eight) hours as needed.   TAMSULOSIN (FLOMAX) 0.4 MG CAPS CAPSULE    Take 1 capsule (0.4 mg total) by mouth daily.     Note:  This document was prepared using Dragon voice recognition software and may include unintentional dictation errors.    Nita Sicklearolina Cressida Milford, MD 05/10/16 2105

## 2016-05-10 NOTE — ED Notes (Signed)
Pt states understanding of discharge instructions. NAD noted at this time.  

## 2016-05-10 NOTE — ED Notes (Signed)
Patient transported to CT 

## 2016-05-12 ENCOUNTER — Encounter: Payer: Self-pay | Admitting: Emergency Medicine

## 2016-05-12 DIAGNOSIS — Z79899 Other long term (current) drug therapy: Secondary | ICD-10-CM | POA: Insufficient documentation

## 2016-05-12 DIAGNOSIS — Z87891 Personal history of nicotine dependence: Secondary | ICD-10-CM | POA: Insufficient documentation

## 2016-05-12 DIAGNOSIS — I10 Essential (primary) hypertension: Secondary | ICD-10-CM | POA: Insufficient documentation

## 2016-05-12 DIAGNOSIS — N200 Calculus of kidney: Secondary | ICD-10-CM | POA: Insufficient documentation

## 2016-05-12 LAB — BASIC METABOLIC PANEL
Anion gap: 9 (ref 5–15)
BUN: 24 mg/dL — AB (ref 6–20)
CHLORIDE: 108 mmol/L (ref 101–111)
CO2: 22 mmol/L (ref 22–32)
CREATININE: 1.18 mg/dL (ref 0.61–1.24)
Calcium: 9.1 mg/dL (ref 8.9–10.3)
GFR calc Af Amer: >60 mL/min (ref >60)
GFR calc non Af Amer: >60 mL/min (ref >60)
Glucose, Bld: 112 mg/dL — ABNORMAL HIGH (ref 65–99)
Potassium: 3.8 mmol/L (ref 3.5–5.1)
SODIUM: 139 mmol/L (ref 135–145)

## 2016-05-12 LAB — URINALYSIS COMPLETE WITH MICROSCOPIC (ARMC ONLY)
BACTERIA UA: NONE SEEN
BILIRUBIN URINE: NEGATIVE
GLUCOSE, UA: NEGATIVE mg/dL
Ketones, ur: NEGATIVE mg/dL
LEUKOCYTES UA: NEGATIVE
NITRITE: NEGATIVE
PH: 7 (ref 5.0–8.0)
Protein, ur: 30 mg/dL — AB
SPECIFIC GRAVITY, URINE: 1.021 (ref 1.005–1.030)
Squamous Epithelial / LPF: NONE SEEN

## 2016-05-12 LAB — CBC
HCT: 46.3 % (ref 40.0–52.0)
Hemoglobin: 15.6 g/dL (ref 13.0–18.0)
MCH: 31.5 pg (ref 26.0–34.0)
MCHC: 33.7 g/dL (ref 32.0–36.0)
MCV: 93.4 fL (ref 80.0–100.0)
PLATELETS: 218 10*3/uL (ref 150–440)
RBC: 4.96 MIL/uL (ref 4.40–5.90)
RDW: 12.5 % (ref 11.5–14.5)
WBC: 15.4 10*3/uL — ABNORMAL HIGH (ref 3.8–10.6)

## 2016-05-12 LAB — URINE CULTURE: CULTURE: NO GROWTH

## 2016-05-12 NOTE — ED Triage Notes (Signed)
Pt presents to ED with c/o right sided flank pain radiating to right side of abd, pt reports was seen here 2 weeks ago and dx with kidney stone, pt reports was seen here multiple times for similar symptoms. Pt reports noticed hematuria and vomiting today. Pt reports has not been able to follow up with urologist.

## 2016-05-12 NOTE — ED Notes (Signed)
Pt unable to provide urine specimen at this time, pt provided with urine specimen cup.

## 2016-05-13 ENCOUNTER — Emergency Department
Admission: EM | Admit: 2016-05-13 | Discharge: 2016-05-13 | Disposition: A | Discharge status: Home / Self Care | Payer: Self-pay | Attending: Emergency Medicine | Admitting: Emergency Medicine

## 2016-05-13 ENCOUNTER — Emergency Department: Payer: Self-pay

## 2016-05-13 DIAGNOSIS — R109 Unspecified abdominal pain: Secondary | ICD-10-CM

## 2016-05-13 DIAGNOSIS — N200 Calculus of kidney: Secondary | ICD-10-CM

## 2016-05-13 MED ORDER — SODIUM CHLORIDE 0.9 % IV BOLUS (SEPSIS)
1000.0000 mL | Freq: Once | INTRAVENOUS | Status: AC
  Administered 2016-05-13: 1000 mL via INTRAVENOUS

## 2016-05-13 MED ORDER — KETOROLAC TROMETHAMINE 30 MG/ML IJ SOLN
30.0000 mg | Freq: Once | INTRAMUSCULAR | Status: AC
  Administered 2016-05-13: 30 mg via INTRAVENOUS
  Filled 2016-05-13: qty 1

## 2016-05-13 MED ORDER — MORPHINE SULFATE (PF) 4 MG/ML IV SOLN: 4.0000 mg | Freq: Once | INTRAVENOUS | Status: DC

## 2016-05-13 MED ORDER — MORPHINE SULFATE (PF) 2 MG/ML IV SOLN
INTRAVENOUS | Status: AC
  Administered 2016-05-13: 4 mg via INTRAVENOUS
  Filled 2016-05-13: qty 2

## 2016-05-13 MED ORDER — OXYCODONE-ACETAMINOPHEN 5-325 MG PO TABS: 1.0000 | ORAL_TABLET | Freq: Four times a day (QID) | ORAL | 0 refills | Status: AC | PRN

## 2016-05-13 MED ORDER — ONDANSETRON HCL 4 MG/2ML IJ SOLN
4.0000 mg | Freq: Once | INTRAMUSCULAR | Status: AC
  Administered 2016-05-13: 4 mg via INTRAVENOUS
  Filled 2016-05-13: qty 2

## 2016-05-13 NOTE — ED Provider Notes (Signed)
[facility] Emergency Department Provider Note   ____________________________________________   First MD Initiated Contact with Patient 05/13/16 0155     (approximate)  I have reviewed the triage vital signs and the nursing notes.   HISTORY  Chief Complaint Flank Pain    HPI Malik Flores is a 35 y.o. male who comes into the hospital today with right-sided flank pain. He also is having some vomiting and urinating blood. The patient reports that he has had these symptoms on and off for 2 weeks. He reports it seemed to be getting bad Sunday night. The patient was here on Saturday and diagnosed with a stone. The patient reports that he has been taking ibuprofen and Flomax but it has not been helping. The patient has not followed up with urology as he does not have insurance and he cannot afford to see them. The patient reports that he could not tolerate the pain at home. He says that the Profen has not been helping. The patient's pain is a 20 out of 10 in intensity. He had a CT scan on the 14th. He reports he has not been drinking much water. He is here for evaluation.   Past Medical History:  Diagnosis Date  . Depression   . Hypertension   . PTSD (post-traumatic stress disorder)     Patient Active Problem List   Diagnosis Date Noted  . Severe major depression, single episode, without psychotic features (HCC) 04/23/2016  . Grief 04/23/2016  . Medication side effects 04/23/2016  . Orthostatic hypotension 04/23/2016  . Hypertension 01/25/2015  . Weight increase 01/25/2015  . Leucocytosis 01/25/2015  . Varicosities of leg 01/25/2015  . Insomnia 10/17/2014  . Migraines 10/17/2014    History reviewed. No pertinent surgical history.  Prior to Admission medications   Medication Sig Start Date End Date Taking? Authorizing Provider  amLODipine (NORVASC) 5 MG tablet Take 5 mg by mouth daily.    Historical Provider, MD  escitalopram (LEXAPRO) 20 MG  tablet Take 20 mg by mouth daily.    Historical Provider, MD  ibuprofen (ADVIL,MOTRIN) 800 MG tablet Take 1 tablet (800 mg total) by mouth every 8 (eight) hours as needed. 05/10/16   Nita Sicklearolina Veronese, MD  meclizine (ANTIVERT) 25 MG tablet Take 1 tablet (25 mg total) by mouth 3 (three) times daily as needed for dizziness or nausea. 04/24/16   Jennye MoccasinBrian S Quigley, MD  naproxen (NAPROSYN) 500 MG tablet Take 1 tablet (500 mg total) by mouth 2 (two) times daily with a meal. 04/26/16   Jene Everyobert Kinner, MD  nortriptyline (PAMELOR) 25 MG capsule Take 25 mg by mouth at bedtime.    Historical Provider, MD  ondansetron (ZOFRAN) 4 MG tablet Take 1 tablet (4 mg total) by mouth daily as needed for nausea or vomiting. 04/26/16   Jene Everyobert Kinner, MD  oxyCODONE-acetaminophen (ROXICET) 5-325 MG tablet Take 1 tablet by mouth every 6 (six) hours as needed. 04/26/16 04/26/17  Jene Everyobert Kinner, MD  oxyCODONE-acetaminophen (ROXICET) 5-325 MG tablet Take 1 tablet by mouth every 6 (six) hours as needed. 05/13/16   Rebecka ApleyAllison P Yordy Matton, MD  prazosin (MINIPRESS) 1 MG capsule Take 1 mg by mouth at bedtime.    Historical Provider, MD  tamsulosin (FLOMAX) 0.4 MG CAPS capsule Take 1 capsule (0.4 mg total) by mouth daily. 05/10/16   Nita Sicklearolina Veronese, MD    Allergies Review of patient's allergies indicates no known allergies.  Family History  Problem Relation Age of Onset  . Diabetes Mother   .  Stroke Mother   . Hypertension Mother   . Diabetes Father   . Heart disease Father   . Lung disease Daughter     chronic  . Cancer Maternal Grandmother     Pancreatic  . Cancer Paternal Grandfather     Social History Social History  Substance Use Topics  . Smoking status: Former Smoker    Quit date: 07/28/2006  . Smokeless tobacco: Never Used  . Alcohol use No    Review of Systems Constitutional: No fever/chills Eyes: No visual changes. ENT: No sore throat. Cardiovascular: Denies chest pain. Respiratory: Denies shortness of  breath. Gastrointestinal: abdominal pain.  nausea, vomiting.  No diarrhea.  No constipation. Genitourinary: Negative for dysuria. Musculoskeletal: back pain. Skin: Negative for rash. Neurological: Negative for headaches, focal weakness or numbness.  10-point ROS otherwise negative.  ____________________________________________   PHYSICAL EXAM:  VITAL SIGNS: ED Triage Vitals [05/12/16 2111]  Enc Vitals Group     BP (!) 161/99     Pulse Rate (!) 199     Resp 18     Temp 97.9 F (36.6 C)     Temp Source Oral     SpO2 97 %     Weight 240 lb (108.9 kg)     Height 6\' 4"  (1.93 m)     Head Circumference      Peak Flow      Pain Score 8     Pain Loc      Pain Edu?      Excl. in GC?     Constitutional: Alert and oriented. Well appearing and in Moderate distress. Eyes: Conjunctivae are normal. PERRL. EOMI. Head: Atraumatic. Nose: No congestion/rhinnorhea. Mouth/Throat: Mucous membranes are moist.  Oropharynx non-erythematous. Cardiovascular: Normal rate, regular rhythm. Grossly normal heart sounds.  Good peripheral circulation. Respiratory: Normal respiratory effort.  No retractions. Lungs CTAB. Gastrointestinal: Soft and nontender. No distention. Positive bowel sounds right sided CVA tenderness to palpation and right abdominal tenderness to palpation Musculoskeletal: No lower extremity tenderness nor edema.  Neurologic:  Normal speech and language.  Skin:  Skin is warm, dry and intact.  Psychiatric: Mood and affect are normal.   ____________________________________________   LABS (all labs ordered are listed, but only abnormal results are displayed)  Labs Reviewed  URINALYSIS COMPLETEWITH MICROSCOPIC ([facility] ONLY) - Abnormal; Notable for the following:       Result Value   Color, Urine YELLOW (*)    APPearance CLOUDY (*)    Hgb urine dipstick 3+ (*)    Protein, ur 30 (*)    All other components within normal limits  BASIC METABOLIC PANEL - Abnormal; Notable for the  following:    Glucose, Bld 112 (*)    BUN 24 (*)    All other components within normal limits  CBC - Abnormal; Notable for the following:    WBC 15.4 (*)    All other components within normal limits   ____________________________________________  EKG  none ____________________________________________  RADIOLOGY  Renal ultrasound KUB ____________________________________________   PROCEDURES  Procedure(s) performed: None  Procedures  Critical Care performed: No  ____________________________________________   INITIAL IMPRESSION / ASSESSMENT AND PLAN / ED COURSE  Pertinent labs & imaging results that were available during my care of the patient were reviewed by me and considered in my medical decision making (see chart for details).  This is a 35 year old male who comes into the hospital today with some abdominal pain and flank pain. The patient was diagnosed with a kidney  stone recently. He reports that the medicine he has at home that wasn't helping and his symptoms have returned. The patient's urinalysis does not show any sign of infection so I will send the patient for a KUB as well as an ultrasound to see if his stone is visible. I will give the patient a liter of normal saline as well as some morphine and Zofran. I will reassess the patient once I have received his results.  Clinical Course  Value Comment By Time  DG Abdomen 1 View 1. 7 mm calcification at the level the right sacral ala, suspected to reflect the patient's recently identified right ureteral stone. 2. Additional punctate calcification overlying the right renal shadow, likely nonobstructive nephrolithiasis. 3. Nonobstructive bowel gas pattern.   Rebecka Apley, MD 10/17 [phone]  US Renal Mild right hydronephrosis.  No stone identified in the right kidney Rebecka Apley, MD 10/17 [phone]   The patient's stone does appear to be lower in his pelvis compared to previous. I will also give the patient a  dose of Toradol and I will reassess the patient once he received his medications.   The patient's pain is improved. He will be discharged home to follow-up with urology. I have encouraged the patient to increase his water intake to help flush out the stone. ____________________________________________   FINAL CLINICAL IMPRESSION(S) / ED DIAGNOSES  Final diagnoses:  Kidney stone  Flank pain      NEW MEDICATIONS STARTED DURING THIS VISIT:  New Prescriptions   OXYCODONE-ACETAMINOPHEN (ROXICET) 5-325 MG TABLET    Take 1 tablet by mouth every 6 (six) hours as needed.     Note:  This document was prepared using Dragon voice recognition software and may include unintentional dictation errors.    Rebecka Apley, MD 05/13/16 [phone]

## 2016-05-13 NOTE — ED Notes (Signed)
MD at bedside. 

## 2016-05-13 NOTE — ED Notes (Signed)
Patient returned from Ultrasound. 

## 2016-05-27 ENCOUNTER — Encounter: Payer: Self-pay | Admitting: Urology

## 2016-05-27 ENCOUNTER — Ambulatory Visit: Payer: Self-pay | Admitting: Urology

## 2017-04-26 IMAGING — US US RENAL
1 series · 14 of 25 positions shown · non-contrast
Comparison: Abdominal radiograph dated 05/13/2016 and CT dated
05/10/2016

CLINICAL DATA: 35-year-old male with right flank pain x4 days.

EXAM:
RENAL / URINARY TRACT ULTRASOUND COMPLETE

[Series 1: us renal · 0.25mm/px · 14 of 30 slices shown]
[im 1/30]
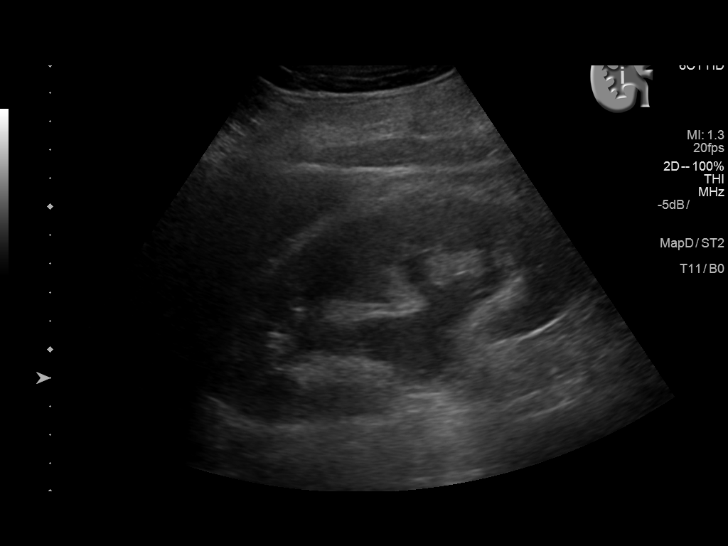
[im 3/30]
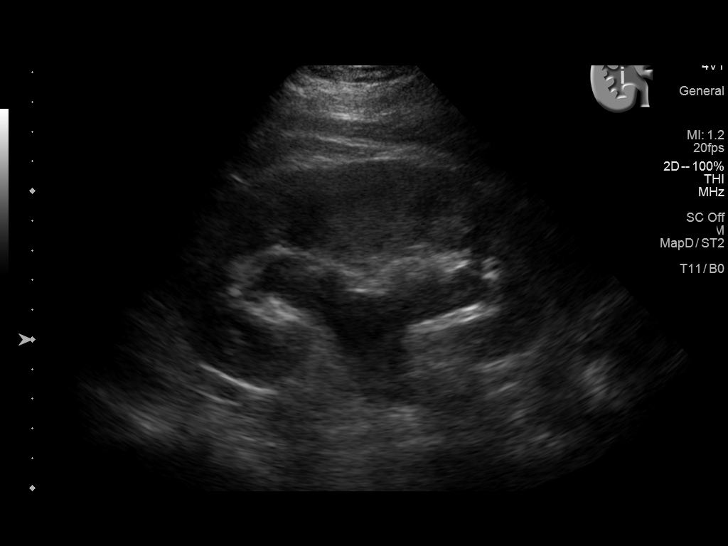
[im 5/30]
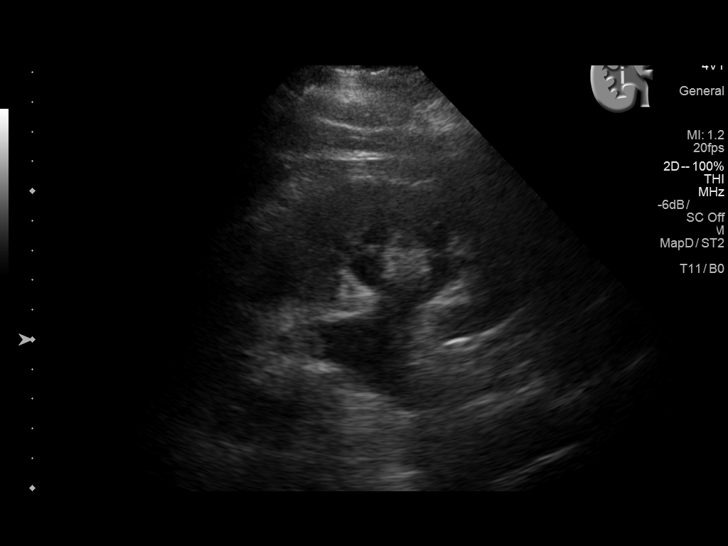
[im 8/30]
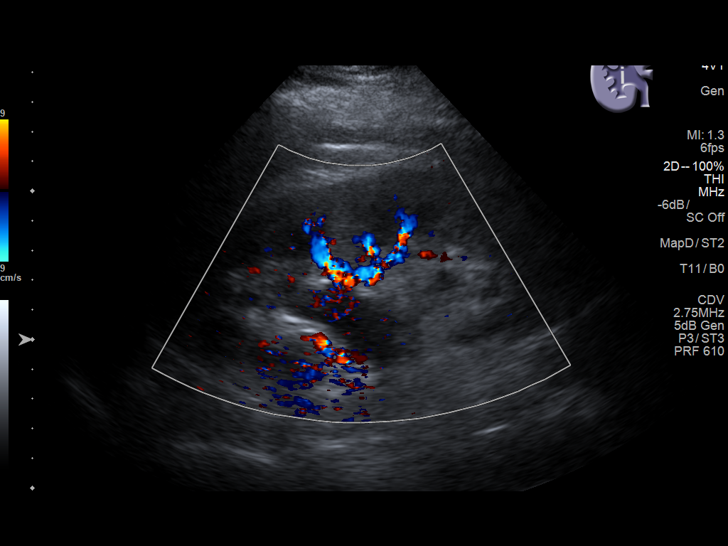
[im 10/30]
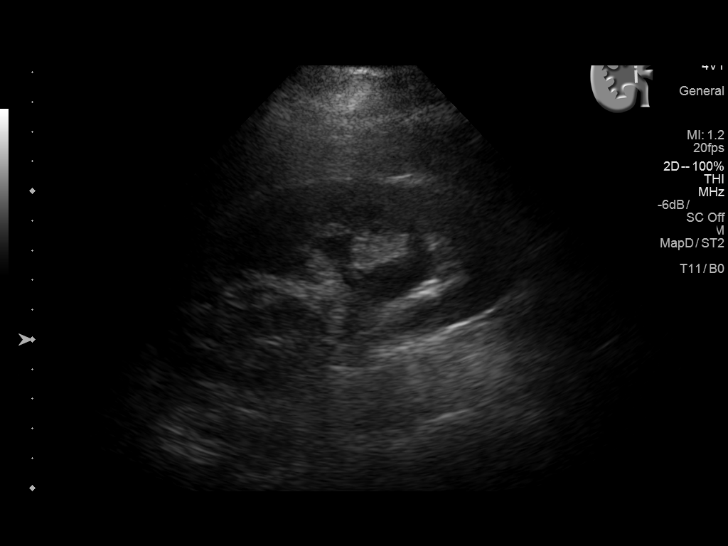
[im 11/30]
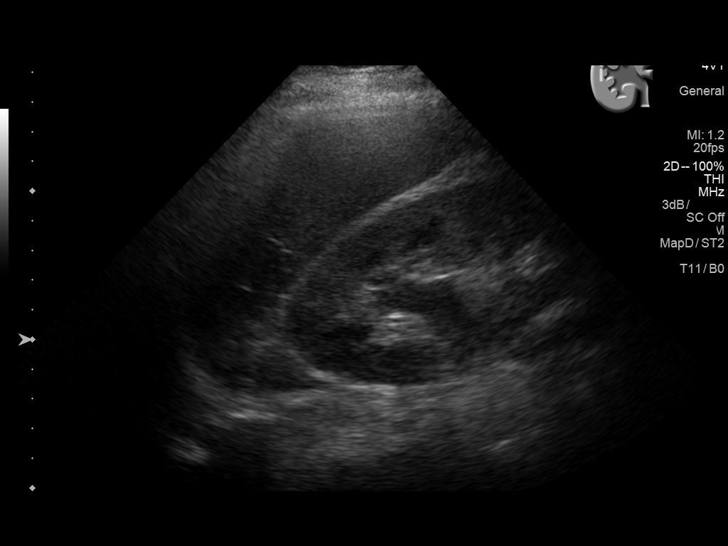
[im 14/30]
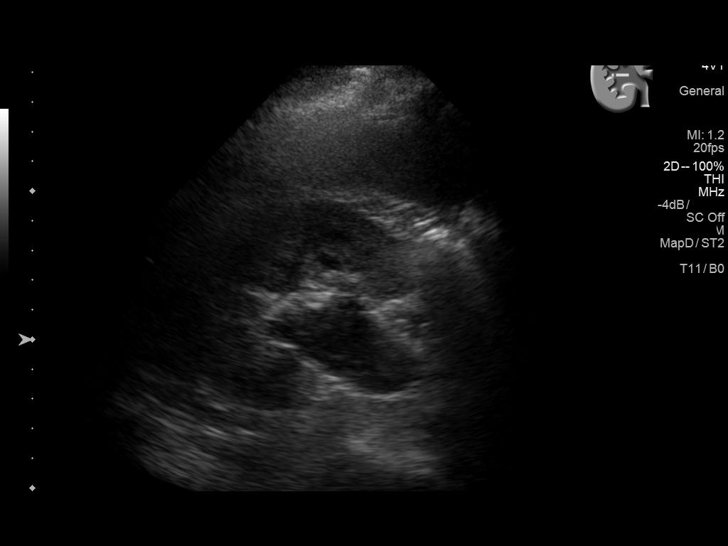
[im 16/30]
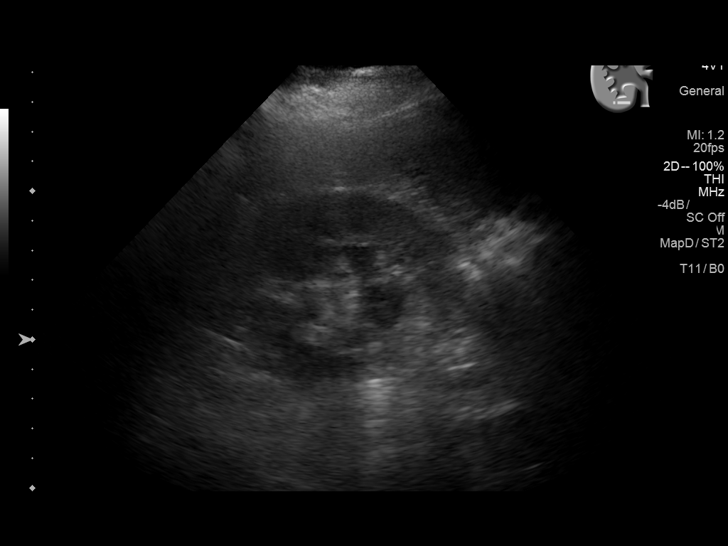
[im 19/30]
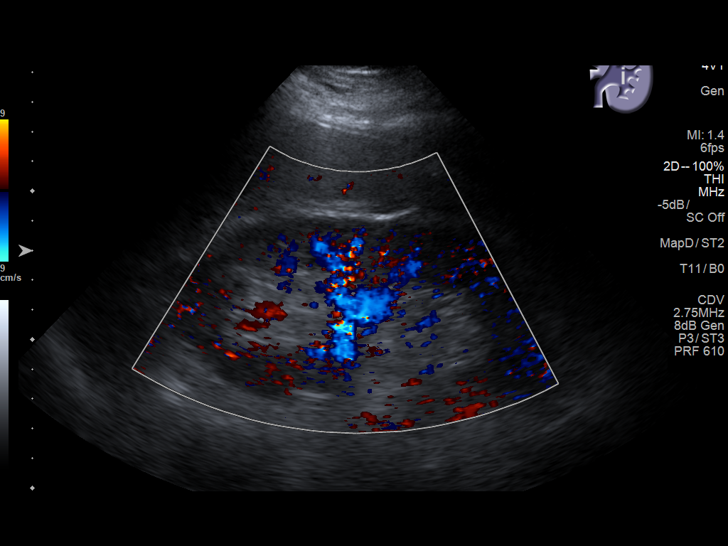
[im 20/30]
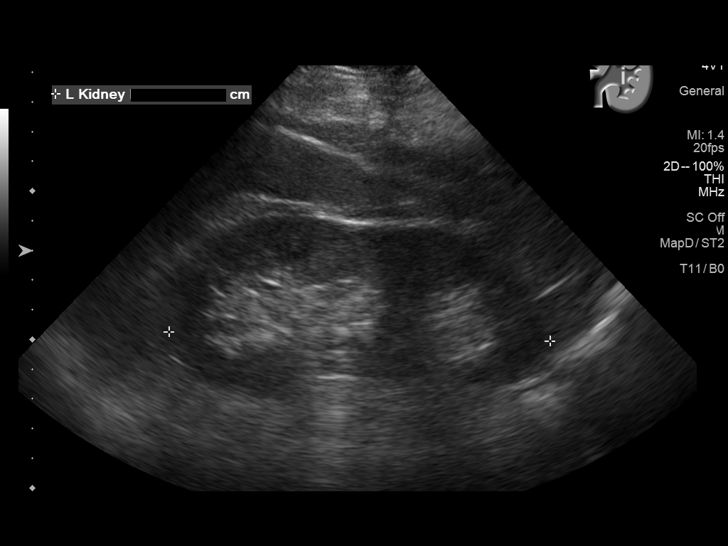
[im 22/30]
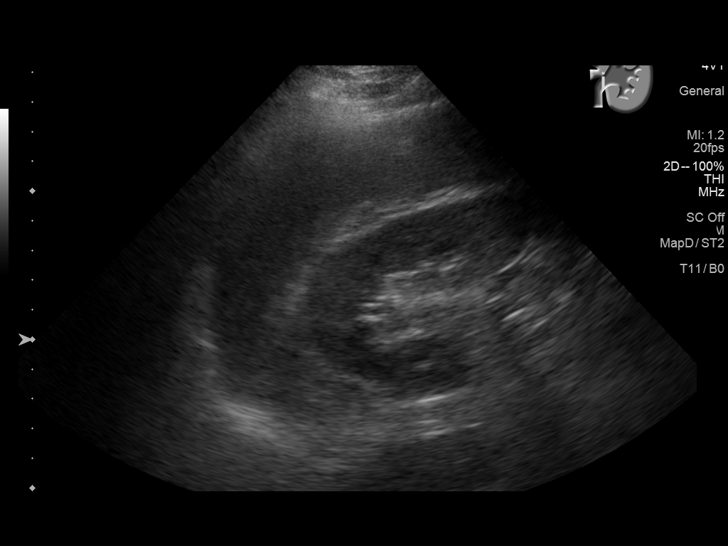
[im 25/30]
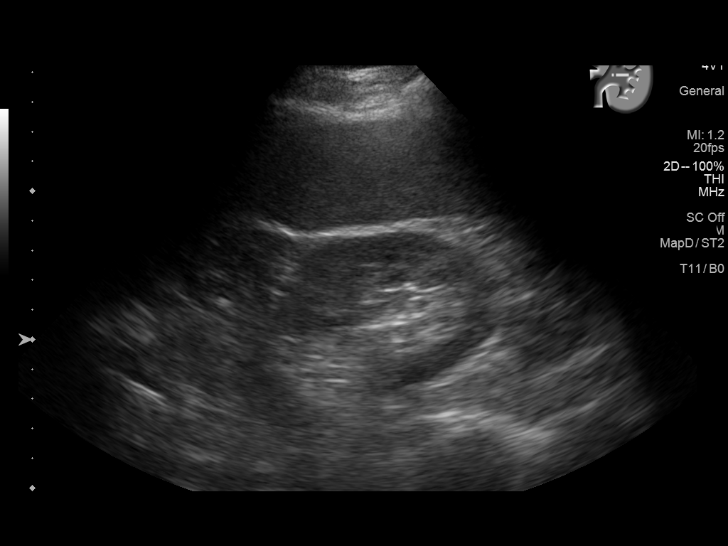
[im 27/30]
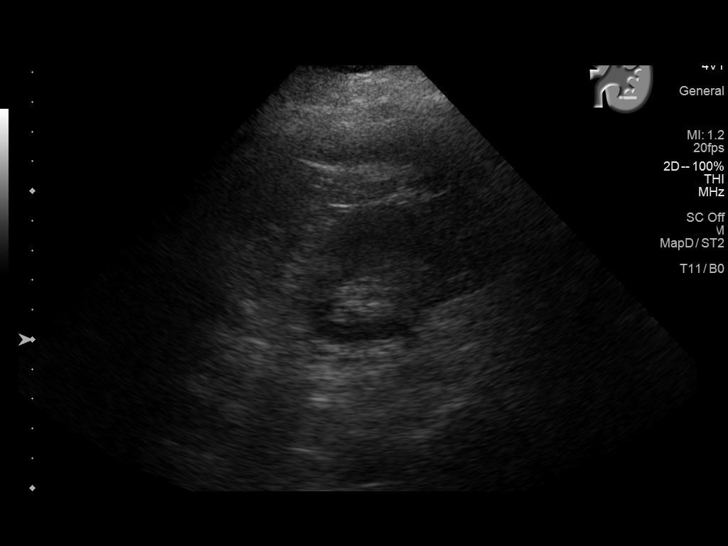
[im 30/30]
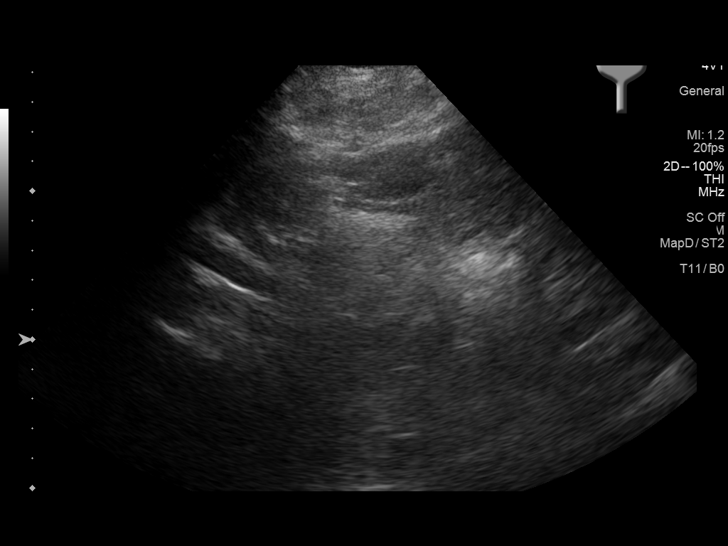

[14 of 25 positions shown; findings below may reference images not displayed]

FINDINGS: Right Kidney:

Length: 13 cm. There is mild hydronephrosis. No echogenic stone
identified.

Left Kidney:

Length: 13 cm. Echogenicity within normal limits. No mass or
hydronephrosis visualized.

Bladder:

The urinary bladder is collapsed.
IMPRESSION: Mild right hydronephrosis.  No stone identified in the right kidney.

## 2018-08-15 IMAGING — CT CT RENAL STONE PROTOCOL
2 of 4 series · 16 of 46 positions shown, 18 images · non-contrast
Comparison: 04/26/2016 CT

CLINICAL DATA: 35-year-old male with continued right flank and
abdominal pain with hematuria. Diagnosed with right urinary calculus
2 weeks ago.

EXAM:
CT ABDOMEN AND PELVIS WITHOUT CONTRAST
TECHNIQUE: Multidetector CT imaging of the abdomen and pelvis was performed
following the standard protocol without IV contrast.

[Series 2: axial st · axial · 0.73mm/px · z∈[-900,-460]mm · 13 of 97 slices shown, 15 images]
[im 5/97  soft-tissue]
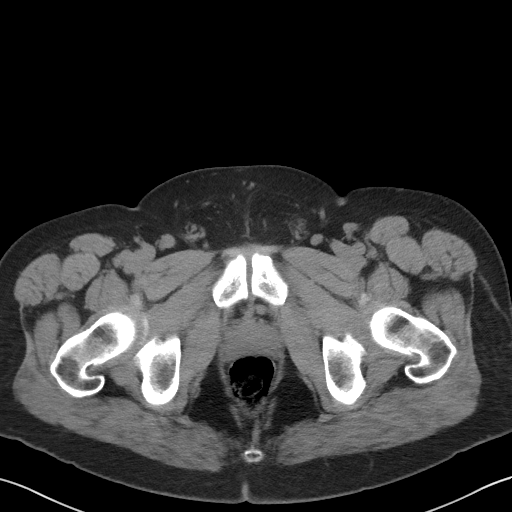
[im 5/97  bone]
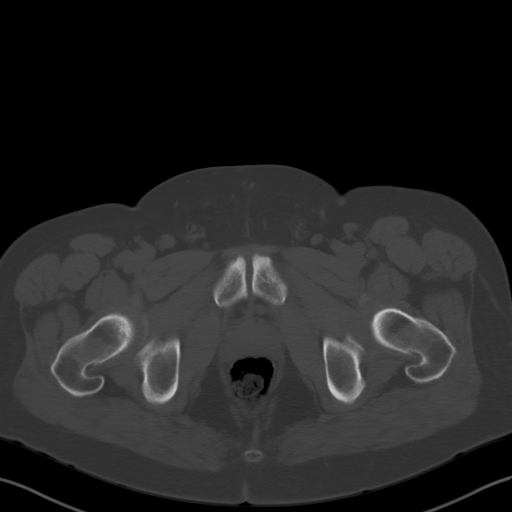
[im 13/97  soft-tissue]
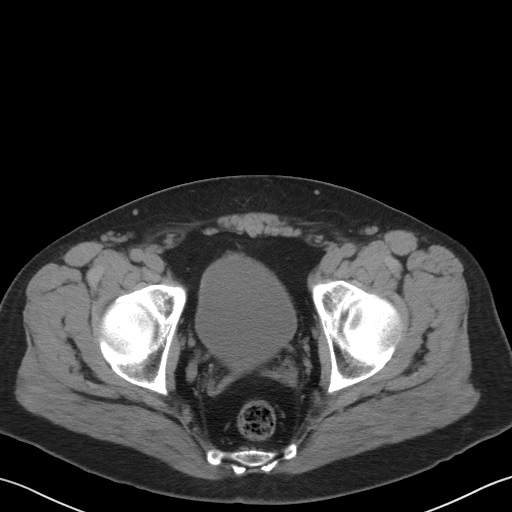
[im 21/97  soft-tissue]
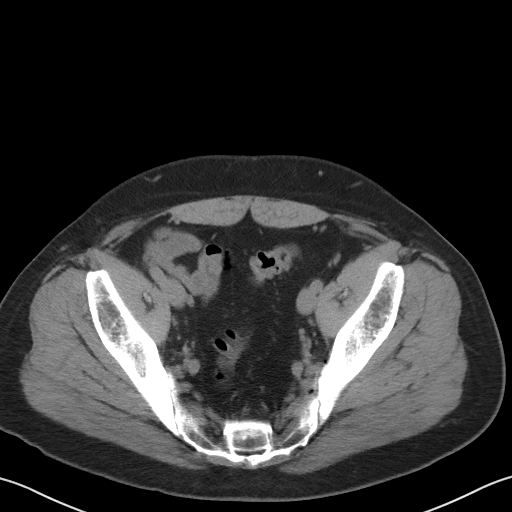
[im 29/97  soft-tissue]
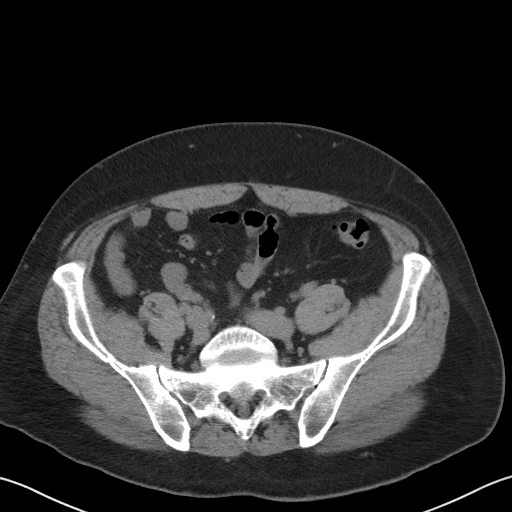
[im 33/97  soft-tissue]
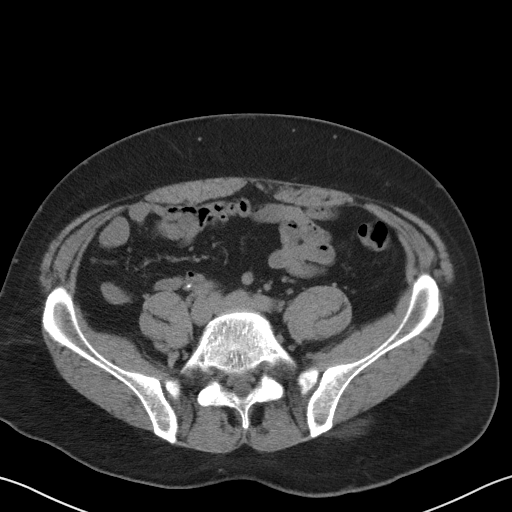
[im 41/97  soft-tissue]
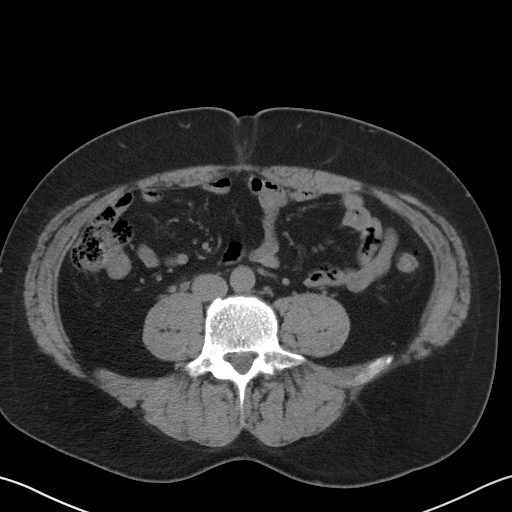
[im 49/97  soft-tissue]
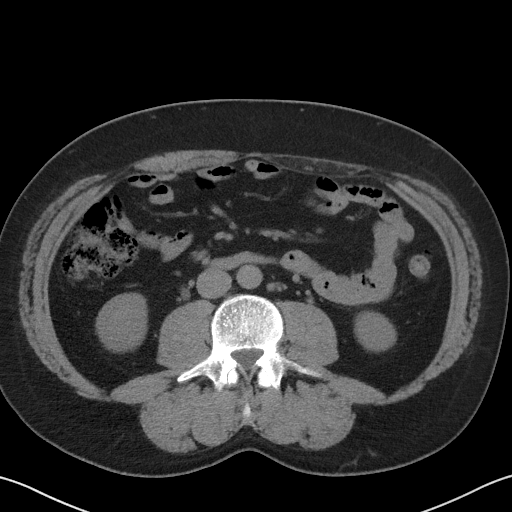
[im 57/97  soft-tissue]
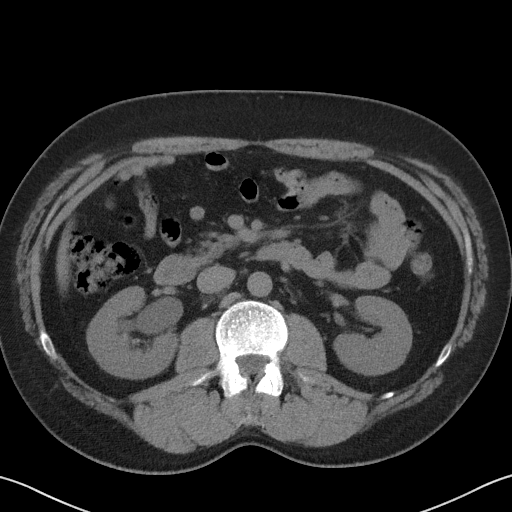
[im 65/97  soft-tissue]
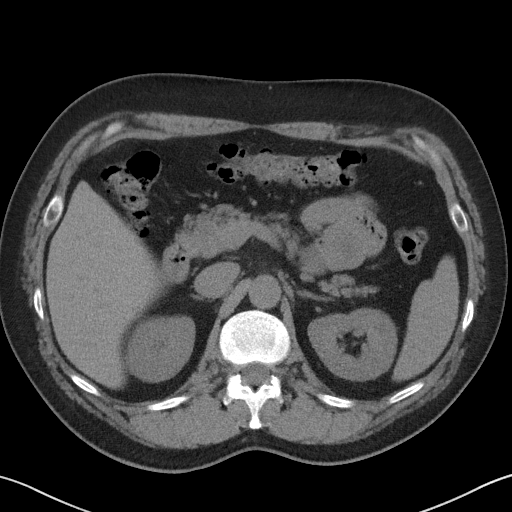
[im 65/97  bone]
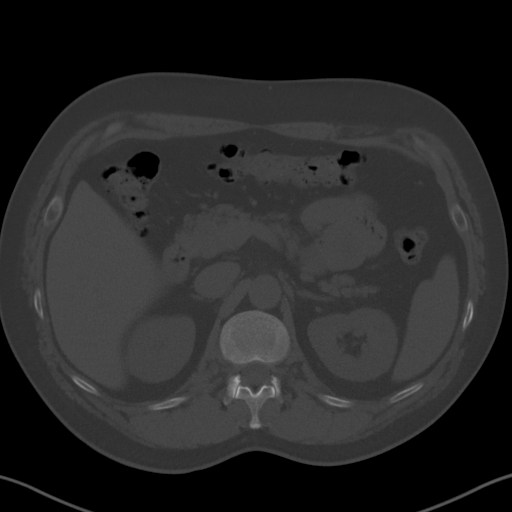
[im 69/97  soft-tissue]
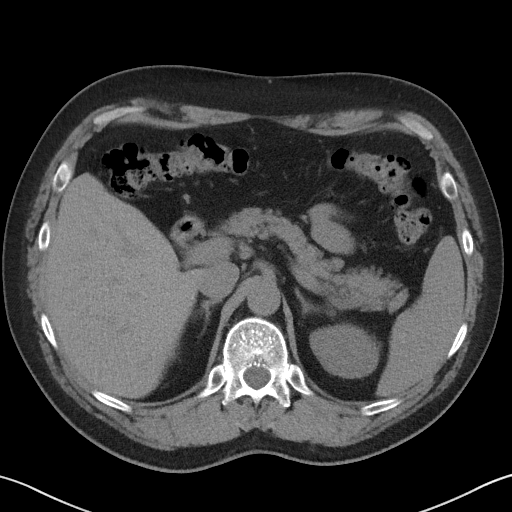
[im 77/97  soft-tissue]
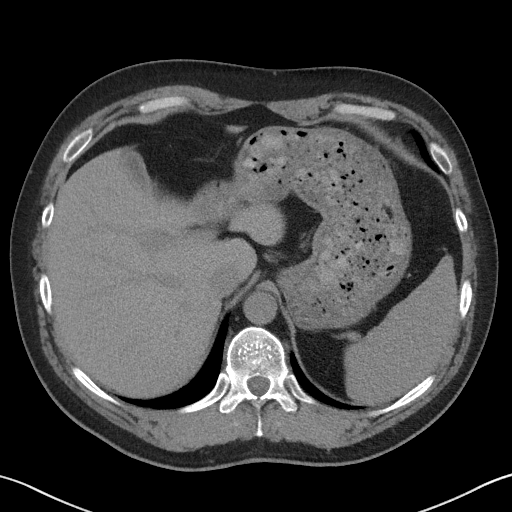
[im 85/97  soft-tissue]
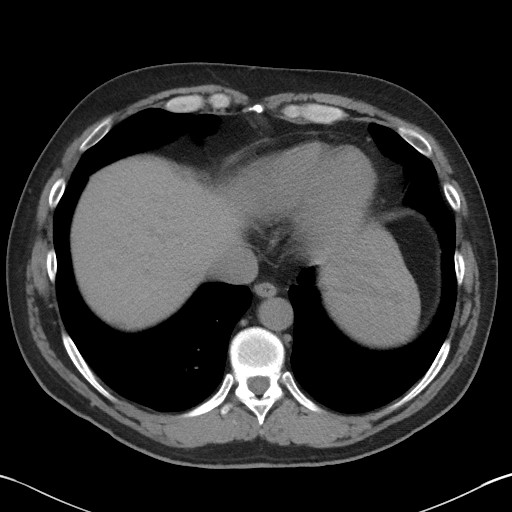
[im 93/97  soft-tissue]
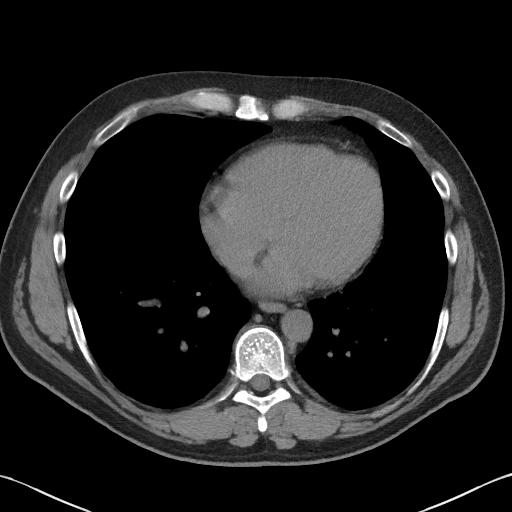

[Series 5: coronal · coronal · 0.75mm/px · 3 of 155 slices shown]
[im 52/155  soft-tissue]
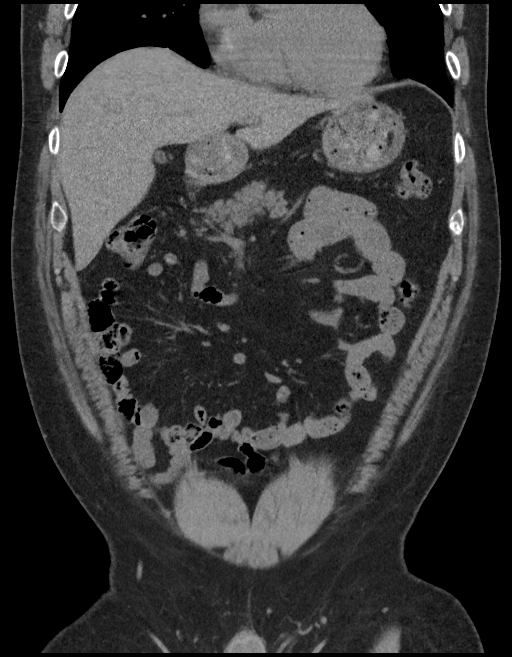
[im 69/155  soft-tissue]
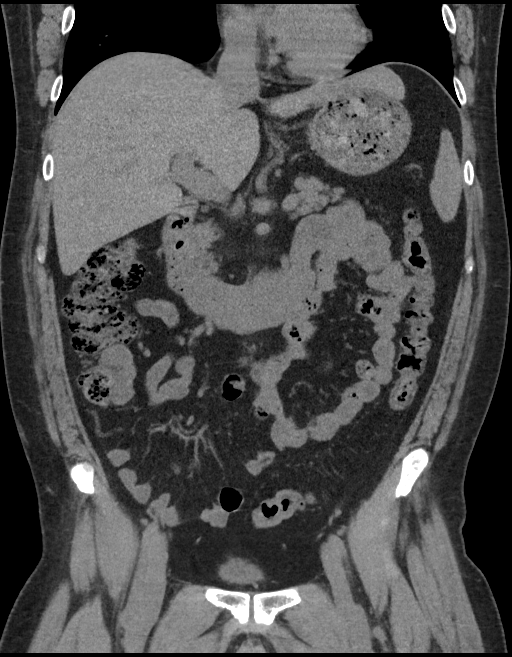
[im 86/155  soft-tissue]
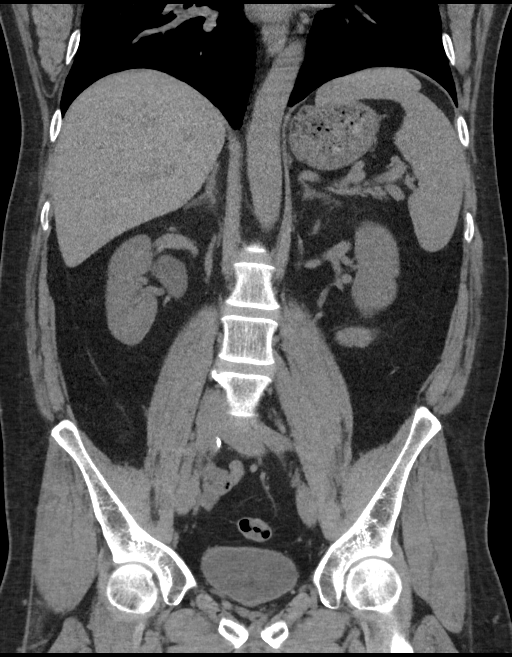

[16 of 46 positions shown; findings below may reference images not displayed]

FINDINGS: Please note that parenchymal abnormalities may be missed without
intravenous contrast.

Lower chest: No acute abnormality.

Hepatobiliary: The liver and gallbladder are unremarkable. There is
no evidence of biliary dilatation.

Pancreas: Unremarkable

Spleen: Unremarkable

Adrenals/Urinary Tract: Increasing moderate right hydronephrosis is
caused by a 6 mm mid right ureteral calculus, has progressed from
the proximal ureter (seen on the 04/26/2016 study). An adjacent 2 mm
calculus within the mid right ureter is noted.

A punctate nonobstructing right lower pole calculus images
identified.

The left kidney, adrenal glands and bladder are unremarkable.

Stomach/Bowel: No bowel obstruction or definite bowel wall
thickening. The appendix is normal.

Vascular/Lymphatic: No significant vascular findings are present. No
enlarged abdominal or pelvic lymph nodes.

Reproductive: Prostate is unremarkable.

Other: No free fluid, focal collection or pneumoperitoneum.

Musculoskeletal: No acute or significant osseous findings.
IMPRESSION: Increasing moderate right hydronephrosis caused by a 6 mm mid right
ureteral calculus, which has progressed from the proximal ureter
seen on the prior study. Adjacent 2 mm calculus in the mid right
ureter.

Punctate nonobstructing right lower pole renal calculus.
